# Patient Record
Sex: Female | Born: 1975 | Race: Black or African American | Hispanic: No | Marital: Single | Smoking: Former smoker
Health system: Southern US, Community
[De-identification: ages and names within clinical notes are randomized; demographics above are authoritative.]

## PROBLEM LIST (undated history)

## (undated) DIAGNOSIS — R7309 Other abnormal glucose: Secondary | ICD-10-CM

## (undated) DIAGNOSIS — R12 Heartburn: Secondary | ICD-10-CM

## (undated) DIAGNOSIS — M1990 Unspecified osteoarthritis, unspecified site: Secondary | ICD-10-CM

## (undated) DIAGNOSIS — J45909 Unspecified asthma, uncomplicated: Secondary | ICD-10-CM

## (undated) DIAGNOSIS — M419 Scoliosis, unspecified: Secondary | ICD-10-CM

## (undated) HISTORY — DX: Unspecified osteoarthritis, unspecified site: M19.90

## (undated) HISTORY — DX: Other abnormal glucose: R73.09

## (undated) HISTORY — DX: Unspecified asthma, uncomplicated: J45.909

## (undated) HISTORY — PX: SPINE SURGERY: SHX786

---

## 2000-12-07 ENCOUNTER — Other Ambulatory Visit: Admission: RE | Admit: 2000-12-07 | Discharge: 2000-12-07 | Payer: Self-pay | Admitting: Family Medicine

## 2001-09-27 ENCOUNTER — Encounter: Admission: RE | Admit: 2001-09-27 | Discharge: 2001-09-27 | Payer: Self-pay | Admitting: Family Medicine

## 2005-07-26 ENCOUNTER — Ambulatory Visit: Payer: Self-pay | Admitting: Internal Medicine

## 2005-07-28 ENCOUNTER — Ambulatory Visit: Payer: Self-pay | Admitting: Internal Medicine

## 2005-08-01 ENCOUNTER — Ambulatory Visit: Payer: Self-pay | Admitting: Internal Medicine

## 2005-08-03 ENCOUNTER — Emergency Department (HOSPITAL_COMMUNITY): Admission: EM | Admit: 2005-08-03 | Discharge: 2005-08-04 | Payer: Self-pay | Admitting: Emergency Medicine

## 2005-08-14 ENCOUNTER — Ambulatory Visit: Payer: Self-pay | Admitting: Internal Medicine

## 2005-10-23 ENCOUNTER — Emergency Department (HOSPITAL_COMMUNITY): Admission: EM | Admit: 2005-10-23 | Discharge: 2005-10-23 | Payer: Self-pay | Admitting: Emergency Medicine

## 2006-06-27 ENCOUNTER — Emergency Department (HOSPITAL_COMMUNITY): Admission: EM | Admit: 2006-06-27 | Discharge: 2006-06-27 | Payer: Self-pay | Admitting: Emergency Medicine

## 2006-11-09 ENCOUNTER — Emergency Department (HOSPITAL_COMMUNITY): Admission: EM | Admit: 2006-11-09 | Discharge: 2006-11-09 | Payer: Self-pay | Admitting: Emergency Medicine

## 2006-12-06 ENCOUNTER — Emergency Department (HOSPITAL_COMMUNITY): Admission: EM | Admit: 2006-12-06 | Discharge: 2006-12-06 | Payer: Self-pay | Admitting: Emergency Medicine

## 2009-02-24 ENCOUNTER — Emergency Department (HOSPITAL_COMMUNITY): Admission: EM | Admit: 2009-02-24 | Discharge: 2009-02-24 | Payer: Self-pay | Admitting: Family Medicine

## 2011-07-19 ENCOUNTER — Emergency Department (HOSPITAL_COMMUNITY)
Admission: EM | Admit: 2011-07-19 | Discharge: 2011-07-19 | Disposition: A | Discharge status: Home / Self Care | Payer: Self-pay | Attending: Emergency Medicine | Admitting: Emergency Medicine

## 2011-07-19 DIAGNOSIS — M79609 Pain in unspecified limb: Secondary | ICD-10-CM | POA: Insufficient documentation

## 2011-07-19 DIAGNOSIS — L299 Pruritus, unspecified: Secondary | ICD-10-CM | POA: Insufficient documentation

## 2011-07-19 DIAGNOSIS — R609 Edema, unspecified: Secondary | ICD-10-CM | POA: Insufficient documentation

## 2011-07-19 DIAGNOSIS — L089 Local infection of the skin and subcutaneous tissue, unspecified: Secondary | ICD-10-CM | POA: Insufficient documentation

## 2012-05-15 ENCOUNTER — Emergency Department (HOSPITAL_COMMUNITY)
Admission: EM | Admit: 2012-05-15 | Discharge: 2012-05-15 | Disposition: A | Discharge status: Home / Self Care | Payer: Medicare Other | Attending: Emergency Medicine | Admitting: Emergency Medicine

## 2012-05-15 ENCOUNTER — Encounter (HOSPITAL_COMMUNITY): Payer: Self-pay | Admitting: *Deleted

## 2012-05-15 DIAGNOSIS — T7840XA Allergy, unspecified, initial encounter: Secondary | ICD-10-CM

## 2012-05-15 DIAGNOSIS — L500 Allergic urticaria: Secondary | ICD-10-CM | POA: Insufficient documentation

## 2012-05-15 DIAGNOSIS — F17200 Nicotine dependence, unspecified, uncomplicated: Secondary | ICD-10-CM | POA: Insufficient documentation

## 2012-05-15 MED ORDER — DIPHENHYDRAMINE HCL 25 MG PO CAPS
25.0000 mg | ORAL_CAPSULE | Freq: Once | ORAL | Status: AC
  Administered 2012-05-15: 25 mg via ORAL
  Filled 2012-05-15: qty 1

## 2012-05-15 MED ORDER — PREDNISONE 20 MG PO TABS
60.0000 mg | ORAL_TABLET | ORAL | Status: AC
  Administered 2012-05-15: 60 mg via ORAL
  Filled 2012-05-15: qty 3

## 2012-05-15 MED ORDER — DIPHENHYDRAMINE HCL 25 MG PO CAPS: 25.0000 mg | ORAL_CAPSULE | Freq: Three times a day (TID) | ORAL | Status: DC

## 2012-05-15 MED ORDER — FAMOTIDINE 20 MG PO TABS
20.0000 mg | ORAL_TABLET | Freq: Once | ORAL | Status: AC
  Administered 2012-05-15: 20 mg via ORAL
  Filled 2012-05-15: qty 1

## 2012-05-15 MED ORDER — PREDNISONE 20 MG PO TABS: 60.0000 mg | ORAL_TABLET | ORAL | Status: AC

## 2012-05-15 MED ORDER — FAMOTIDINE 20 MG PO TABS: 60.0000 mg | ORAL_TABLET | Freq: Every day | ORAL | Status: DC

## 2012-05-15 NOTE — Discharge Instructions (Signed)
As discussed, if your symptoms persist or worsen in the next 24 hours you should be seen either here or at clinic for further evaluation. 1 if you do not develop any changes, and your symptoms resolve, please be sure to see the clinic physicians as soon as possible to establish an ongoing relationship, as it is very important to have a primary care physician.

## 2012-05-15 NOTE — ED Provider Notes (Signed)
History   This chart was scribed for Gerhard Munch, MD by Shari Heritage. The patient was seen in room TR09C/TR09C. Patient's care was started at 1437.     CSN: [id number]  Arrival date & time 05/15/12  1437   First MD Initiated Contact with Patient 05/15/12 1548      Chief Complaint  Patient presents with  . Arm Pain  . Rash    (Consider location/radiation/quality/duration/timing/severity/associated sxs/prior treatment) Patient is a 36 y.o. female presenting with rash and hand pain.  Rash  This is a new problem. The current episode started yesterday. The problem has been gradually improving. The problem is associated with an unknown factor. There has been no fever. The rash is present on the left arm. The pain is mild. The pain has been improving since onset. Associated symptoms include pain. She has tried nothing for the symptoms. The treatment provided no relief.  Hand Pain This is a new problem. The current episode started yesterday. The problem occurs constantly. The problem has been gradually improving. Pertinent negatives include no chest pain, no abdominal pain, no headaches and no shortness of breath.   Robin Richards is a 36 y.o. female who presents to the Emergency Department complaining of moderate, left hand pain and swelling. Patient also complains of hives and mild swelling in her left arm. Patient says both sets of symptoms began yesterday. Patient says she has been doing anything differently that she thinks may have caused these changes. Patient denies SOB, fever, chills, nausea, vomiting and diarrhea. Patient also denies weakness in hands or arms bilaterally. Patient with surgical h/o spine surgery. Patient is a current some day smoker.   Patient has no PCP.  History reviewed. No pertinent past medical history.  Past Surgical History  Procedure Date  . Spine surgery     No family history on file.  History  Substance Use Topics  . Smoking status: Current Some  Day Smoker  . Smokeless tobacco: Not on file  . Alcohol Use: No    OB History    Grav Para Term Preterm Abortions TAB SAB Ect Mult Living                  Review of Systems  Constitutional:       Per HPI, otherwise negative  HENT:       Per HPI, otherwise negative  Eyes: Negative.   Respiratory: Negative for shortness of breath.        Per HPI, otherwise negative  Cardiovascular: Negative for chest pain.       Per HPI, otherwise negative  Gastrointestinal: Negative for vomiting and abdominal pain.  Genitourinary: Negative.   Musculoskeletal:       Per HPI, otherwise negative  Skin: Positive for rash.  Neurological: Negative for syncope and headaches.    Allergies  Review of patient's allergies indicates no known allergies.  Home Medications  No current outpatient prescriptions on file.  BP 148/91  Pulse 90  Temp 97.3 F (36.3 C) (Oral)  Resp 16  SpO2 98%  Physical Exam  Nursing note and vitals reviewed. Constitutional: She is oriented to person, place, and time. She appears well-developed and well-nourished. No distress.  HENT:  Head: Normocephalic and atraumatic.  Eyes: Conjunctivae and EOM are normal.  Cardiovascular: Normal rate and regular rhythm.   Pulmonary/Chest: Effort normal and breath sounds normal. No stridor. No respiratory distress.  Abdominal: She exhibits no distension.  Musculoskeletal: She exhibits no edema.  Equal strength in hands.   Neurological: She is alert and oriented to person, place, and time. No cranial nerve deficit.  Skin: Skin is warm and dry.       R ue arm, medial patch, ~10cm of urticarial change  Psychiatric: She has a normal mood and affect.    ED Course  Procedures (including critical care time) DIAGNOSTIC STUDIES:  COORDINATION OF CARE: 4:12PM- Patient informed of current plan for treatment and evaluation and agrees with plan at this time. Will prescribe medication to reduce hives and hand pain. Recommended that  patient follow up with referred physician or return to the ED if symptoms persists or worsen over the next 24 hours.    Labs Reviewed - No data to display No results found.   No diagnosis found.    MDM  I personally performed the services described in this documentation, which was scribed in my presence. The recorded information has been reviewed and considered.  This 36 year old female now presents with concerns of urticarial lesion on the right and hand swelling on the left.  Although there is no identifiable focal lesion, the patient's description of swelling and urticarial rash is most consistent with an allergic reaction.  The patient's endorsement of a gradual improvement in her hand is somewhat reassuring for the likelihood of a resulting phenomena, however given the persistency of the urticarial changes on her right arm she was provided steroids, antihistamines.  With no   Gerhard Munch, MD 05/15/12 1743

## 2012-05-15 NOTE — ED Notes (Signed)
Spoke with pt regarding maintenance at home for allergic reaction. Pt verbalized understanding to wash sheets at home and any blankets. Pt states she will check dog for fleas, states she has not seen any.

## 2012-05-15 NOTE — ED Notes (Signed)
Patient reports she woke with swelling in her left arm.  She has pain up to her shoulder and increased pain with movement. Patient also has noted hive/rash to her arms.

## 2012-09-17 ENCOUNTER — Encounter (HOSPITAL_COMMUNITY): Payer: Self-pay | Admitting: *Deleted

## 2012-09-17 DIAGNOSIS — R0789 Other chest pain: Secondary | ICD-10-CM | POA: Insufficient documentation

## 2012-09-17 DIAGNOSIS — Z79899 Other long term (current) drug therapy: Secondary | ICD-10-CM | POA: Insufficient documentation

## 2012-09-17 DIAGNOSIS — M4120 Other idiopathic scoliosis, site unspecified: Secondary | ICD-10-CM | POA: Insufficient documentation

## 2012-09-17 DIAGNOSIS — F17200 Nicotine dependence, unspecified, uncomplicated: Secondary | ICD-10-CM | POA: Insufficient documentation

## 2012-09-17 DIAGNOSIS — R12 Heartburn: Secondary | ICD-10-CM | POA: Insufficient documentation

## 2012-09-17 DIAGNOSIS — R109 Unspecified abdominal pain: Secondary | ICD-10-CM | POA: Insufficient documentation

## 2012-09-17 DIAGNOSIS — J45909 Unspecified asthma, uncomplicated: Secondary | ICD-10-CM | POA: Insufficient documentation

## 2012-09-17 DIAGNOSIS — R110 Nausea: Secondary | ICD-10-CM | POA: Insufficient documentation

## 2012-09-17 NOTE — ED Notes (Signed)
Pt to ED c/o heartburn and nausea when she laid down after eating barbeque chicken.  She also is c/o lower abd pain, like "when I drink a lot of sodas".  Pt states that she always feels sob when she's sick d/t hx of asthma.  Pt presently burping.

## 2012-09-17 NOTE — ED Notes (Signed)
PT reports chest pressure with heart burn like feeling. Pt also reports SHOB .

## 2012-09-18 ENCOUNTER — Emergency Department (HOSPITAL_COMMUNITY)
Admission: EM | Admit: 2012-09-18 | Discharge: 2012-09-18 | Disposition: A | Discharge status: Home / Self Care | Payer: Medicare Other | Attending: Emergency Medicine | Admitting: Emergency Medicine

## 2012-09-18 ENCOUNTER — Emergency Department (HOSPITAL_COMMUNITY): Payer: Medicare Other

## 2012-09-18 DIAGNOSIS — R0600 Dyspnea, unspecified: Secondary | ICD-10-CM

## 2012-09-18 DIAGNOSIS — R109 Unspecified abdominal pain: Secondary | ICD-10-CM

## 2012-09-18 HISTORY — DX: Scoliosis, unspecified: M41.9

## 2012-09-18 HISTORY — DX: Heartburn: R12

## 2012-09-18 LAB — URINALYSIS, ROUTINE W REFLEX MICROSCOPIC
Glucose, UA: NEGATIVE mg/dL
Ketones, ur: NEGATIVE mg/dL
Protein, ur: NEGATIVE mg/dL

## 2012-09-18 LAB — CBC WITH DIFFERENTIAL/PLATELET
Basophils Absolute: 0.1 10*3/uL (ref 0.0–0.1)
Eosinophils Relative: 4 % (ref 0–5)
HCT: 41.5 % (ref 36.0–46.0)
Lymphocytes Relative: 26 % (ref 12–46)
MCV: 72.7 fL — ABNORMAL LOW (ref 78.0–100.0)
Monocytes Absolute: 1.1 10*3/uL — ABNORMAL HIGH (ref 0.1–1.0)
RDW: 14.6 % (ref 11.5–15.5)
WBC: 13.9 10*3/uL — ABNORMAL HIGH (ref 4.0–10.5)

## 2012-09-18 LAB — COMPREHENSIVE METABOLIC PANEL
ALT: 17 U/L (ref 0–35)
AST: 20 U/L (ref 0–37)
Albumin: 4 g/dL (ref 3.5–5.2)
Alkaline Phosphatase: 121 U/L — ABNORMAL HIGH (ref 39–117)
BUN: 12 mg/dL (ref 6–23)
CO2: 27 mEq/L (ref 19–32)
Calcium: 9.4 mg/dL (ref 8.4–10.5)
Creatinine, Ser: 0.76 mg/dL (ref 0.50–1.10)
Glucose, Bld: 162 mg/dL — ABNORMAL HIGH (ref 70–99)
Total Bilirubin: 0.3 mg/dL (ref 0.3–1.2)

## 2012-09-18 LAB — URINE MICROSCOPIC-ADD ON

## 2012-09-18 MED ORDER — TRAMADOL HCL 50 MG PO TABS: 50.0000 mg | ORAL_TABLET | Freq: Four times a day (QID) | ORAL | Status: DC | PRN

## 2012-09-18 NOTE — ED Provider Notes (Signed)
History     CSN: [id number]  Arrival date & time 09/17/12  2335   First MD Initiated Contact with Patient 09/18/12 0132      Chief Complaint  Patient presents with  . Shortness of Breath  . Nausea  . Heartburn    (Consider location/radiation/quality/duration/timing/severity/associated sxs/prior treatment) Patient is a 36 y.o. female presenting with shortness of breath and heartburn. The history is provided by the patient.  Shortness of Breath  Associated symptoms include shortness of breath. Pertinent negatives include no chest pain, no fever and no cough.  Heartburn Associated symptoms include abdominal pain and shortness of breath. Pertinent negatives include no chest pain and no headaches.   36 year old, female, with scoliosis and asthma, who continues to smoke, presents to emergency department with intermittent shortness of breath, and intermittent, lower abdominal pain.  She denies a cough, fevers, chills, or sweating.  She says the abdominal pain lasts only a minute at a time.  Presently, she is asymptomatic.  She denies urinary tract symptoms, vaginal bleeding or vaginal discharge.  She has not had diarrhea.  Past Medical History  Diagnosis Date  . Heartburn   . Scoliosis     Past Surgical History  Procedure Date  . Spine surgery     No family history on file.  History  Substance Use Topics  . Smoking status: Current Some Day Smoker    Types: Cigarettes  . Smokeless tobacco: Not on file  . Alcohol Use: No    OB History    Grav Para Term Preterm Abortions TAB SAB Ect Mult Living                  Review of Systems  Constitutional: Negative for fever, chills and diaphoresis.  HENT: Negative for congestion.   Respiratory: Positive for shortness of breath. Negative for cough and chest tightness.   Cardiovascular: Negative for chest pain.  Gastrointestinal: Positive for heartburn and abdominal pain. Negative for nausea, vomiting, diarrhea and constipation.    Genitourinary: Negative for dysuria and hematuria.  Neurological: Negative for headaches.  Psychiatric/Behavioral: Negative for confusion.  All other systems reviewed and are negative.    Allergies  Review of patient's allergies indicates no known allergies.  Home Medications   Current Outpatient Rx  Name Route Sig Dispense Refill  . DIPHENHYDRAMINE HCL 25 MG PO CAPS Oral Take 1 capsule (25 mg total) by mouth 3 (three) times daily. 6 capsule 0    BP 121/75  Pulse 84  Temp 97.9 F (36.6 C) (Oral)  Resp 19  SpO2 100%  LMP 08/22/2012  Physical Exam  Nursing note and vitals reviewed. Constitutional: She is oriented to person, place, and time. She appears well-developed and well-nourished. No distress.  HENT:  Head: Normocephalic and atraumatic.  Eyes: Conjunctivae normal and EOM are normal. Pupils are equal, round, and reactive to light.  Neck: Normal range of motion. Neck supple.  Cardiovascular: Normal rate and intact distal pulses.   No murmur heard. Pulmonary/Chest: Effort normal and breath sounds normal. No respiratory distress.  Abdominal: Soft. Bowel sounds are normal. She exhibits no distension. There is no tenderness.  Musculoskeletal: Normal range of motion. She exhibits no edema.  Neurological: She is alert and oriented to person, place, and time.  Skin: Skin is warm and dry.  Psychiatric: She has a normal mood and affect. Thought content normal.    ED Course  Procedures (including critical care time)  Labs Reviewed  URINALYSIS, ROUTINE W REFLEX MICROSCOPIC - Abnormal;  Notable for the following:    APPearance CLOUDY (*)     Hgb urine dipstick TRACE (*)     All other components within normal limits  CBC WITH DIFFERENTIAL - Abnormal; Notable for the following:    WBC 13.9 (*)     RBC 5.71 (*)     MCV 72.7 (*)     MCH 23.8 (*)     Neutro Abs 8.6 (*)     Monocytes Absolute 1.1 (*)     All other components within normal limits  COMPREHENSIVE METABOLIC  PANEL - Abnormal; Notable for the following:    Glucose, Bld 162 (*)     Alkaline Phosphatase 121 (*)     All other components within normal limits  URINE MICROSCOPIC-ADD ON - Abnormal; Notable for the following:    Squamous Epithelial / LPF FEW (*)     All other components within normal limits  TROPONIN I  LIPASE, BLOOD  POCT PREGNANCY, URINE   No results found.   No diagnosis found.    MDM  Dyspnea resolved Abdominal pain, which is crampy in nature, lasting only a minute time is presently, resolved.  No acute abdomen        Cheri Guppy, MD 09/18/12 [phone]

## 2012-11-18 ENCOUNTER — Emergency Department (HOSPITAL_COMMUNITY)
Admission: EM | Admit: 2012-11-18 | Discharge: 2012-11-18 | Disposition: A | Discharge status: Home / Self Care | Payer: Medicare Other | Attending: Emergency Medicine | Admitting: Emergency Medicine

## 2012-11-18 ENCOUNTER — Encounter (HOSPITAL_COMMUNITY): Payer: Self-pay | Admitting: Emergency Medicine

## 2012-11-18 DIAGNOSIS — Z9889 Other specified postprocedural states: Secondary | ICD-10-CM | POA: Insufficient documentation

## 2012-11-18 DIAGNOSIS — M542 Cervicalgia: Secondary | ICD-10-CM | POA: Insufficient documentation

## 2012-11-18 DIAGNOSIS — F17200 Nicotine dependence, unspecified, uncomplicated: Secondary | ICD-10-CM | POA: Insufficient documentation

## 2012-11-18 DIAGNOSIS — Z8739 Personal history of other diseases of the musculoskeletal system and connective tissue: Secondary | ICD-10-CM | POA: Insufficient documentation

## 2012-11-18 DIAGNOSIS — M62838 Other muscle spasm: Secondary | ICD-10-CM | POA: Insufficient documentation

## 2012-11-18 MED ORDER — OXYCODONE-ACETAMINOPHEN 5-325 MG PO TABS: 1.0000 | ORAL_TABLET | Freq: Four times a day (QID) | ORAL | Status: DC | PRN

## 2012-11-18 MED ORDER — IBUPROFEN 800 MG PO TABS: 800.0000 mg | ORAL_TABLET | Freq: Three times a day (TID) | ORAL | Status: DC | PRN

## 2012-11-18 NOTE — ED Notes (Signed)
Pt c/o right shoulder pain x 2 days; pt denies obvious injury but sts hx of similar in past with back problems

## 2012-11-18 NOTE — ED Provider Notes (Signed)
History   This chart was scribed for Jisell Majer B. Bernette Mayers, MD, by Frederik Pear, ER scribe. The patient was seen in room TR07C/TR07C and the patient's care was started at 1424.    CSN: [id number]  Arrival date & time 11/18/12  1408   First MD Initiated Contact with Patient 11/18/12 1424      Chief Complaint  Patient presents with  . Shoulder Pain    (Consider location/radiation/quality/duration/timing/severity/associated sxs/prior treatment) HPI Robin Richards is a 36 y.o. female with a h/o of back surgery and chronic back pain who presents to the Emergency Department complaining of constant, moderate, non-radiating, right shoulder pain that is aggravated by movement and  began 2 days ago. She denies any treatment at home. She has had similar symptoms before. No known injury.    No PCP.   Past Medical History  Diagnosis Date  . Heartburn   . Scoliosis     Past Surgical History  Procedure Date  . Spine surgery     History reviewed. No pertinent family history.  History  Substance Use Topics  . Smoking status: Current Some Day Smoker    Types: Cigarettes  . Smokeless tobacco: Not on file  . Alcohol Use: No    OB History    Grav Para Term Preterm Abortions TAB SAB Ect Mult Living                  Review of Systems A complete 10 system review of systems was obtained and all systems are negative except as noted in the HPI and PMH.  Allergies  Review of patient's allergies indicates no known allergies.  Home Medications   Current Outpatient Rx  Name  Route  Sig  Dispense  Refill  . IBUPROFEN 800 MG PO TABS   Oral   Take 1 tablet (800 mg total) by mouth every 8 (eight) hours as needed for pain.   30 tablet   0   . OXYCODONE-ACETAMINOPHEN 5-325 MG PO TABS   Oral   Take 1-2 tablets by mouth every 6 (six) hours as needed for pain.   20 tablet   0     BP 128/76  Pulse 94  Temp 99.4 F (37.4 C) (Oral)  Resp 20  Ht 5\' 5"  (1.651 m)  Wt 155 lb (70.308 kg)   BMI 25.79 kg/m2  SpO2 98%  Physical Exam  Nursing note and vitals reviewed. Constitutional: She is oriented to person, place, and time. She appears well-developed and well-nourished.  HENT:  Head: Normocephalic and atraumatic.  Eyes: EOM are normal. Pupils are equal, round, and reactive to light.  Neck: Normal range of motion. Neck supple.  Cardiovascular: Normal rate, normal heart sounds and intact distal pulses.   Pulmonary/Chest: Effort normal and breath sounds normal.  Abdominal: Bowel sounds are normal. She exhibits no distension. There is no tenderness.  Musculoskeletal: Normal range of motion. She exhibits no edema and no tenderness.       She has soft tissue tenderness over the right trapezius, but no bony tenderness or deformity. She has full ROM and is neurovascularly intact.  Neurological: She is alert and oriented to person, place, and time. She has normal strength. No cranial nerve deficit or sensory deficit.  Skin: Skin is warm and dry. No rash noted.  Psychiatric: She has a normal mood and affect.    ED Course  Procedures (including critical care time)  DIAGNOSTIC STUDIES: Oxygen Saturation is 98% on room air, normal by my interpretation.  COORDINATION OF CARE:   14:36- Discussed planned course of treatment with the patient, including warm compresses and follow up with a PCP, who is agreeable at this time.   Labs Reviewed - No data to display No results found.   No diagnosis found.    MDM  Muscle spasm, no bony tendenress, no indication for imaging. Pain meds, PCP referral.    I personally performed the services described in this documentation, which was scribed in my presence. The recorded information has been reviewed and is accurate.        Eleen Litz B. Bernette Mayers, MD 11/18/12 [phone]

## 2012-11-18 NOTE — ED Notes (Signed)
Pt woke this am with right neck and shoulder pain. Hx of surgery as a child, "I got a rod in my back".

## 2013-05-26 ENCOUNTER — Emergency Department (HOSPITAL_COMMUNITY)
Admission: EM | Admit: 2013-05-26 | Discharge: 2013-05-26 | Disposition: A | Discharge status: Home / Self Care | Payer: Medicare Other | Attending: Emergency Medicine | Admitting: Emergency Medicine

## 2013-05-26 ENCOUNTER — Encounter (HOSPITAL_COMMUNITY): Payer: Self-pay | Admitting: Emergency Medicine

## 2013-05-26 DIAGNOSIS — Y998 Other external cause status: Secondary | ICD-10-CM | POA: Insufficient documentation

## 2013-05-26 DIAGNOSIS — R12 Heartburn: Secondary | ICD-10-CM | POA: Insufficient documentation

## 2013-05-26 DIAGNOSIS — M4120 Other idiopathic scoliosis, site unspecified: Secondary | ICD-10-CM | POA: Insufficient documentation

## 2013-05-26 DIAGNOSIS — M419 Scoliosis, unspecified: Secondary | ICD-10-CM

## 2013-05-26 DIAGNOSIS — W57XXXA Bitten or stung by nonvenomous insect and other nonvenomous arthropods, initial encounter: Secondary | ICD-10-CM

## 2013-05-26 DIAGNOSIS — F17200 Nicotine dependence, unspecified, uncomplicated: Secondary | ICD-10-CM | POA: Insufficient documentation

## 2013-05-26 DIAGNOSIS — IMO0001 Reserved for inherently not codable concepts without codable children: Secondary | ICD-10-CM | POA: Insufficient documentation

## 2013-05-26 DIAGNOSIS — Y929 Unspecified place or not applicable: Secondary | ICD-10-CM | POA: Insufficient documentation

## 2013-05-26 MED ORDER — DIPHENHYDRAMINE HCL 25 MG PO CAPS
25.0000 mg | ORAL_CAPSULE | Freq: Once | ORAL | Status: AC
  Administered 2013-05-26: 25 mg via ORAL
  Filled 2013-05-26: qty 1

## 2013-05-26 MED ORDER — HYDROCORTISONE 1 % EX CREA: TOPICAL_CREAM | CUTANEOUS | Status: DC

## 2013-05-26 NOTE — ED Notes (Signed)
Started 2 days ago with a "bump" on left forearm. Appears to be a mole but it itches. Has other bites on arm.

## 2013-05-26 NOTE — ED Provider Notes (Signed)
History    CSN: [id number] Arrival date & time 05/26/13  4098  First MD Initiated Contact with Patient 05/26/13 1047     Chief Complaint  Patient presents with  . Rash   (Consider location/radiation/quality/duration/timing/severity/associated sxs/prior Treatment) Patient is a 37 y.o. female presenting with rash. The history is provided by the patient. No language interpreter was used.  Rash Associated symptoms: no chest pain, no chills, no cough, no fever and no shortness of breath   Robin Richards is a 37 y/o F with PMHx of heartburn and scoliosis presenting to the ED with 2 bumps on her left forearm that have been there since 2 days ago - reported that they are extremely pruritic, but have decreased in time. Reported that thye were pink in color in the beginning, but have now scabbed over. Patient reported that she has been using rubbing alcohol with minimal relief. Denied any pain or discomfort. Denied fever, chills, pain with motion, decreased motion, numbness, tingling, hiking, headache, loss of sensation, being in the woods. PCP: none  Past Medical History  Diagnosis Date  . Heartburn   . Scoliosis    Past Surgical History  Procedure Laterality Date  . Spine surgery     No family history on file. History  Substance Use Topics  . Smoking status: Current Some Day Smoker    Types: Cigarettes  . Smokeless tobacco: Not on file  . Alcohol Use: No   OB History   Grav Para Term Preterm Abortions TAB SAB Ect Mult Living                 Review of Systems  Constitutional: Negative for fever and chills.  HENT: Negative for congestion, facial swelling, rhinorrhea and neck pain.   Eyes: Negative for pain and visual disturbance.  Respiratory: Negative for cough, chest tightness and shortness of breath.   Cardiovascular: Negative for chest pain.  Musculoskeletal: Negative for back pain and arthralgias.  Skin: Positive for rash.  Neurological: Negative for dizziness, weakness,  numbness and headaches.  All other systems reviewed and are negative.    Allergies  Review of patient's allergies indicates no known allergies.  Home Medications   Current Outpatient Rx  Name  Route  Sig  Dispense  Refill  . hydrocortisone cream 1 %      Apply to affected area 2 times daily   15 g   0    BP 179/99  Pulse 89  Temp(Src) 97.6 F (36.4 C) (Oral)  SpO2 100% Physical Exam  Nursing note and vitals reviewed. Constitutional: She is oriented to person, place, and time. She appears well-developed and well-nourished. No distress.  HENT:  Head: Normocephalic and atraumatic.  Eyes: Conjunctivae and EOM are normal. Pupils are equal, round, and reactive to light. Right eye exhibits no discharge. Left eye exhibits no discharge.  Neck: Normal range of motion. Neck supple.  Cardiovascular: Normal rate, regular rhythm and normal heart sounds.  Exam reveals no friction rub.   No murmur heard. Pulmonary/Chest: Effort normal and breath sounds normal. No respiratory distress. She has no wheezes. She has no rales.  Lymphadenopathy:    She has no cervical adenopathy.  Neurological: She is alert and oriented to person, place, and time. No cranial nerve deficit. She exhibits normal muscle tone. Coordination normal.  Skin: Skin is warm and dry. No rash noted. She is not diaphoretic. There is erythema.  Mild swelling noted to the left forearm, extensor surface. Two small 0.5 cm bumps  noted - with centers that are indurated, warm to touch, surround erythema. Negative drainage. Negative erythema migrans.    Psychiatric: She has a normal mood and affect. Her behavior is normal. Thought content normal.    ED Course  Procedures (including critical care time) Labs Reviewed - No data to display No results found. 1. Bug bite   2. Scoliosis     MDM  Patient presenting to ED with small 0.5 cm bumps to left extensor surface of forearm. Mild erythema noted, mild warmth. Suspicion to be bug  bites. Doubt septic nature, doubt tic bite. Patient stable, afebrile. Benadryl given in ED setting. Discharged patient with hydrocortisone cream for topical use. Discussed with patient care and hygiene. [facility]-up and dermatologist if worsens. Discussed with patient to monitor symptoms and if symptoms are to worsen or change to report back to the ED - strict return instructions given. Patient agreed to plan of care, understood, all questions answered.   Raymon Mutton, PA-C 05/26/13 1709

## 2013-05-27 NOTE — ED Provider Notes (Signed)
Medical screening examination/treatment/procedure(s) were performed by non-physician practitioner and as supervising physician I was immediately available for consultation/collaboration.  Derwood Kaplan, MD 05/27/13 2116

## 2013-07-15 ENCOUNTER — Encounter (HOSPITAL_COMMUNITY): Payer: Self-pay | Admitting: Nurse Practitioner

## 2013-07-15 ENCOUNTER — Emergency Department (HOSPITAL_COMMUNITY)
Admission: EM | Admit: 2013-07-15 | Discharge: 2013-07-15 | Disposition: A | Discharge status: Home / Self Care | Payer: Medicare Other | Attending: Emergency Medicine | Admitting: Emergency Medicine

## 2013-07-15 DIAGNOSIS — M549 Dorsalgia, unspecified: Secondary | ICD-10-CM

## 2013-07-15 DIAGNOSIS — M546 Pain in thoracic spine: Secondary | ICD-10-CM | POA: Insufficient documentation

## 2013-07-15 DIAGNOSIS — Z8739 Personal history of other diseases of the musculoskeletal system and connective tissue: Secondary | ICD-10-CM | POA: Insufficient documentation

## 2013-07-15 DIAGNOSIS — F17200 Nicotine dependence, unspecified, uncomplicated: Secondary | ICD-10-CM | POA: Insufficient documentation

## 2013-07-15 DIAGNOSIS — R209 Unspecified disturbances of skin sensation: Secondary | ICD-10-CM | POA: Insufficient documentation

## 2013-07-15 DIAGNOSIS — Z9889 Other specified postprocedural states: Secondary | ICD-10-CM | POA: Insufficient documentation

## 2013-07-15 DIAGNOSIS — IMO0002 Reserved for concepts with insufficient information to code with codable children: Secondary | ICD-10-CM | POA: Insufficient documentation

## 2013-07-15 DIAGNOSIS — M542 Cervicalgia: Secondary | ICD-10-CM | POA: Insufficient documentation

## 2013-07-15 MED ORDER — METHOCARBAMOL 500 MG PO TABS: 500.0000 mg | ORAL_TABLET | Freq: Two times a day (BID) | ORAL | Status: DC

## 2013-07-15 NOTE — ED Notes (Addendum)
Pt states the whole R side of her body has been aching for 2 days, took ibuprofen and muscle relaxer with no relief. History of back surgery for scoliosis when she was 13, had rod placed in back, denies any injuries. Ambulatory, [facility]

## 2013-07-15 NOTE — ED Provider Notes (Signed)
  CSN: [id number]     Arrival date & time 07/15/13  1101 History     First MD Initiated Contact with Patient 07/15/13 1212     Chief Complaint  Patient presents with  . Generalized Body Aches   (Consider location/radiation/quality/duration/timing/severity/associated sxs/prior Treatment) HPI Comments: Patient reports right neck and right upper back pain x 2 days.  Denies injury.  States it feels like a muscle spasm.  Has occasional tingling in her right arm.  Pt has significant scoliosis.  Denies fever, chills, neck pain, arm weakness, chest pain, SOB. States she took one "muscle relaxant" last night without improvement and ibuprofen this morning without improvement.   The history is provided by the patient.    Past Medical History  Diagnosis Date  . Heartburn   . Scoliosis    Past Surgical History  Procedure Laterality Date  . Spine surgery     History reviewed. No pertinent family history. History  Substance Use Topics  . Smoking status: Current Some Day Smoker    Types: Cigarettes  . Smokeless tobacco: Not on file  . Alcohol Use: No   OB History   Grav Para Term Preterm Abortions TAB SAB Ect Mult Living                 Review of Systems  Constitutional: Negative for fever and chills.  HENT: Negative for neck pain and neck stiffness.   Respiratory: Negative for cough and shortness of breath.   Cardiovascular: Negative for chest pain.  Neurological: Negative for weakness.    Allergies  Review of patient's allergies indicates no known allergies.  Home Medications   Current Outpatient Rx  Name  Route  Sig  Dispense  Refill  . hydrocortisone cream 1 %      Apply to affected area 2 times daily   15 g   0    BP 138/96  Pulse 99  Temp(Src) 98.2 F (36.8 C) (Oral)  Resp 16  Ht 5\' 4"  (1.626 m)  Wt 167 lb (75.751 kg)  BMI 28.65 kg/m2  SpO2 100% Physical Exam  Nursing note and vitals reviewed. Constitutional: She appears well-developed and well-nourished. No  distress.  HENT:  Head: Normocephalic and atraumatic.  Neck: Neck supple.  Pulmonary/Chest: Effort normal.  Musculoskeletal:       Right shoulder: Normal.       Cervical back: Normal.       Arms: Right upper extremity: Strength 5/5, sensation intact, distal pulses intact.     Neurological: She is alert.  Skin: She is not diaphoretic.    ED Course   Procedures (including critical care time)  Labs Reviewed - No data to display No results found. 1. Upper back pain on right side     MDM  Pt with right upper back pain, no injury, no tenderness, no red flags.  Neurovascularly intact. No neck pain or injury.  Appears to be muscular soreness. D/C home with robaxin four times daily prn, PCP follow up.  Discussed  findings, treatment, follow up  with patient.  Pt given return precautions.  Pt verbalizes understanding and agrees with plan.     Trixie Dredge, PA-C 07/15/13 1249

## 2013-07-15 NOTE — ED Provider Notes (Signed)
Medical screening examination/treatment/procedure(s) were performed by non-physician practitioner and as supervising physician I was immediately available for consultation/collaboration.  Blimy Napoleon L Demiah Gullickson, MD 07/15/13 1650 

## 2013-08-28 ENCOUNTER — Emergency Department (HOSPITAL_COMMUNITY)
Admission: EM | Admit: 2013-08-28 | Discharge: 2013-08-28 | Disposition: A | Discharge status: Home / Self Care | Payer: Medicare Other | Attending: Emergency Medicine | Admitting: Emergency Medicine

## 2013-08-28 ENCOUNTER — Encounter (HOSPITAL_COMMUNITY): Payer: Self-pay

## 2013-08-28 DIAGNOSIS — Z8739 Personal history of other diseases of the musculoskeletal system and connective tissue: Secondary | ICD-10-CM | POA: Insufficient documentation

## 2013-08-28 DIAGNOSIS — F17200 Nicotine dependence, unspecified, uncomplicated: Secondary | ICD-10-CM | POA: Insufficient documentation

## 2013-08-28 DIAGNOSIS — H6091 Unspecified otitis externa, right ear: Secondary | ICD-10-CM

## 2013-08-28 DIAGNOSIS — H60399 Other infective otitis externa, unspecified ear: Secondary | ICD-10-CM | POA: Insufficient documentation

## 2013-08-28 MED ORDER — CIPROFLOXACIN-DEXAMETHASONE 0.3-0.1 % OT SUSP: 4.0000 [drp] | Freq: Two times a day (BID) | OTIC | Status: DC

## 2013-08-28 NOTE — ED Notes (Signed)
Pt presents with 2 day h/o R ear pain.  Pt reports thin drainage to ear.

## 2013-08-28 NOTE — ED Provider Notes (Signed)
Medical screening examination/treatment/procedure(s) were performed by non-physician practitioner and as supervising physician I was immediately available for consultation/collaboration.   Joya Gaskins, MD 08/28/13 [phone]

## 2013-08-28 NOTE — ED Notes (Signed)
Pt states pain 8/10 to rt ear X2 days.  Pt states there was clear drainage but has stopped.  Pt states she did use a qtip to clean ear but stopped bc it was hurting to deep.  Pt alert oriented X4

## 2013-08-28 NOTE — ED Provider Notes (Signed)
CSN: [id number]     Arrival date & time 08/28/13  1344 History   First MD Initiated Contact with Patient 08/28/13 1412     No chief complaint on file.  (Consider location/radiation/quality/duration/timing/severity/associated sxs/prior Treatment) HPI Patient presents to the emergency department for evaluation of right ear pain which she has had for 2 days. She reports a thin drainage coming from her ear and also endorses that it has been itching. She says she used to have 2-3 years and she was younger. Denies getting frequent ear infections as an adult. Denies having underlying medical issues. Denies having fever, sore throat, neck pain, cough, shortness of breath, weakness. Past Medical History  Diagnosis Date  . Heartburn   . Scoliosis    Past Surgical History  Procedure Laterality Date  . Spine surgery     History reviewed. No pertinent family history. History  Substance Use Topics  . Smoking status: Current Some Day Smoker    Types: Cigarettes  . Smokeless tobacco: Not on file  . Alcohol Use: No   OB History   Grav Para Term Preterm Abortions TAB SAB Ect Mult Living                 Review of Systems  HENT: Positive for ear pain.   All other systems reviewed and are negative.    Allergies  Review of patient's allergies indicates no known allergies.  Home Medications   Current Outpatient Rx  Name  Route  Sig  Dispense  Refill  . ciprofloxacin-dexamethasone (CIPRODEX) otic suspension   Right Ear   Place 4 drops into the right ear 2 (two) times daily.   7.5 mL   0    BP 126/67  Pulse 90  Temp(Src) 97.7 F (36.5 C) (Oral)  Resp 18  SpO2 98%  LMP 08/24/2013 Physical Exam  Nursing note and vitals reviewed. Constitutional: She appears well-developed and well-nourished. No distress.  HENT:  Head: Normocephalic and atraumatic.  Right Ear: There is drainage and tenderness.  Left Ear: Tympanic membrane and ear canal normal.  Eyes: Pupils are equal, round, and  reactive to light.  Neck: Normal range of motion. Neck supple.  Cardiovascular: Normal rate and regular rhythm.   Pulmonary/Chest: Effort normal.  Abdominal: Soft.  Neurological: She is alert.  Skin: Skin is warm and dry.    ED Course  Procedures (including critical care time) Labs Review Labs Reviewed - No data to display Imaging Review No results found.  MDM   1. Otitis externa of right ear    Otitis externa without concerns of mastoiditis. No systemic symptoms. Will treat with Ciprodex otic drops.  37 y.o.Robin Richards's evaluation in the Emergency Department is complete. It has been determined that no acute conditions requiring further emergency intervention are present at this time. The patient/guardian have been advised of the diagnosis and plan. We have discussed signs and symptoms that warrant return to the ED, such as changes or worsening in symptoms.  Vital signs are stable at discharge. Filed Vitals:   08/28/13 1358  BP: 126/67  Pulse: 90  Temp: 97.7 F (36.5 C)  Resp: 18    Patient/guardian has voiced understanding and agreed to follow-up with the PCP or specialist.    Dorthula Matas, PA-C 08/28/13 1447  Dorthula Matas, PA-C 08/28/13 1448

## 2013-09-29 ENCOUNTER — Encounter (HOSPITAL_COMMUNITY): Payer: Self-pay | Admitting: Emergency Medicine

## 2013-09-29 ENCOUNTER — Emergency Department (INDEPENDENT_AMBULATORY_CARE_PROVIDER_SITE_OTHER)
Admission: EM | Admit: 2013-09-29 | Discharge: 2013-09-29 | Disposition: A | Discharge status: Home / Self Care | Payer: Medicare Other | Source: Home / Self Care | Attending: Emergency Medicine | Admitting: Emergency Medicine

## 2013-09-29 DIAGNOSIS — M5431 Sciatica, right side: Secondary | ICD-10-CM

## 2013-09-29 DIAGNOSIS — M5430 Sciatica, unspecified side: Secondary | ICD-10-CM

## 2013-09-29 MED ORDER — KETOROLAC TROMETHAMINE 60 MG/2ML IM SOLN
INTRAMUSCULAR | Status: AC
  Filled 2013-09-29: qty 2

## 2013-09-29 MED ORDER — MELOXICAM 15 MG PO TABS: 15.0000 mg | ORAL_TABLET | Freq: Every day | ORAL | Status: DC

## 2013-09-29 MED ORDER — TRAMADOL HCL 50 MG PO TABS: 100.0000 mg | ORAL_TABLET | Freq: Three times a day (TID) | ORAL | Status: DC | PRN

## 2013-09-29 MED ORDER — KETOROLAC TROMETHAMINE 60 MG/2ML IM SOLN
60.0000 mg | Freq: Once | INTRAMUSCULAR | Status: AC
  Administered 2013-09-29: 60 mg via INTRAMUSCULAR

## 2013-09-29 MED ORDER — METHOCARBAMOL 500 MG PO TABS: 500.0000 mg | ORAL_TABLET | Freq: Three times a day (TID) | ORAL | Status: DC

## 2013-09-29 NOTE — ED Notes (Signed)
C/o upper mid back pain at bra line. Hx of corrective back surgery for scoliosis. Denies injury. Pt has been taking ibuprofen with no relief. Onset 3 days ago.   States pain is a constant ache.

## 2013-09-29 NOTE — ED Provider Notes (Signed)
Chief Complaint:   Chief Complaint  Patient presents with  . Back Pain    upper mid back. hx of back surgery    History of Present Illness:   Robin Richards is a 37 year old female who's had a three-day history of pain in her right lower back with radiation into her buttock, down the posterior thigh and into her calf. The leg feels numb and tingly. The pain is rated an 8/10 in intensity. It's worse with any movement. She tried ibuprofen without relief. She denies any weakness. She's had no dysuria, frequency, urgency, hematuria, urinary incontinence, or urinary retention. No incontinence of stool. She denies any abdominal pain. She's had no fever, chills, or unintended weight loss. She has a history of scoliosis and has a Harrington rod. She denies any injury to her back. No previous history of back or disc problems.  Review of Systems:  Other than noted above, the patient denies any of the following symptoms: Systemic:  No fever, chills, severe fatigue, or unexplained weight loss. GI:  No abdominal pain, nausea, vomiting, diarrhea, constipation, incontinence of bowel, or blood in stool. GU:  No dysuria, frequency, urgency, or hematuria. No incontinence of urine or difficulty urinating.  M-S:  No neck pain, joint pain, arthritis, or myalgias. Neuro:  No paresthesias, saddle anesthesia, muscular weakness, or progressive neurological deficit.  PMFSH:  Past medical history, family history, social history, meds, and allergies were reviewed. Specifically, there is no history of cancer, major trauma, osteoporosis, immunosuppression, or HIV infection.   Physical Exam:   Vital signs:  BP 122/74  Pulse 56  Temp(Src) 97.9 F (36.6 C) (Oral)  Resp 16  SpO2 100%  LMP 09/23/2013 General:  Alert, oriented, in no distress. Abdomen:  Soft, non-tender.  No organomegaly or mass.  No pulsatile midline abdominal mass or bruit. Back:  She has a scar from a Harrington rod. The back has a limited range of motion  with only about 45 of flexion with pain. This may be due to the Harrington rod placement. There is pain to palpation just above the iliac crest in the sacroiliac joint. Straight leg raising was positive on the right with a positive Lasegue's sign and positive popliteal compression. Straight leg raising negative on the left. Neuro:  Normal muscle strength, sensations and DTRs. Extremities: Pedal pulses were full, there was no edema. Skin:  Clear, warm and dry.  No rash.  Assessment:  The encounter diagnosis was Sciatica, right.  Recommended conservative treatment for right now. Will need to followup with orthopedics.  Plan:   1.  Meds:  The following meds were prescribed:   Discharge Medication List as of 09/29/2013  4:25 PM    START taking these medications   Details  meloxicam (MOBIC) 15 MG tablet Take 1 tablet (15 mg total) by mouth daily., Starting 09/29/2013, Until Discontinued, Normal    methocarbamol (ROBAXIN) 500 MG tablet Take 1 tablet (500 mg total) by mouth 3 (three) times daily., Starting 09/29/2013, Until Discontinued, Normal    traMADol (ULTRAM) 50 MG tablet Take 2 tablets (100 mg total) by mouth every 8 (eight) hours as needed for pain., Starting 09/29/2013, Until Discontinued, Normal        2.  Patient Education/Counseling:  The patient was given appropriate handouts, self care instructions, and instructed in symptomatic relief. The patient was encouraged to try to be as active as possible and given some exercises to do followed by moist heat.  3.  Follow up:  The patient was  told to follow up if no better in 3 to 4 days, if becoming worse in any way, and given some red flag symptoms such as worsening pain or new neurological symptoms which would prompt immediate return.  Follow up with Dr. Charlann Boxer in 2 weeks.     Reuben Likes, MD 09/29/13 [phone]

## 2013-12-04 ENCOUNTER — Encounter (HOSPITAL_COMMUNITY): Payer: Self-pay | Admitting: Emergency Medicine

## 2013-12-04 ENCOUNTER — Emergency Department (HOSPITAL_COMMUNITY)
Admission: EM | Admit: 2013-12-04 | Discharge: 2013-12-04 | Disposition: A | Discharge status: Home / Self Care | Payer: Medicare Other | Attending: Emergency Medicine | Admitting: Emergency Medicine

## 2013-12-04 DIAGNOSIS — Z8739 Personal history of other diseases of the musculoskeletal system and connective tissue: Secondary | ICD-10-CM | POA: Insufficient documentation

## 2013-12-04 DIAGNOSIS — R062 Wheezing: Secondary | ICD-10-CM | POA: Insufficient documentation

## 2013-12-04 DIAGNOSIS — Z3202 Encounter for pregnancy test, result negative: Secondary | ICD-10-CM | POA: Insufficient documentation

## 2013-12-04 DIAGNOSIS — Z79899 Other long term (current) drug therapy: Secondary | ICD-10-CM | POA: Insufficient documentation

## 2013-12-04 DIAGNOSIS — K529 Noninfective gastroenteritis and colitis, unspecified: Secondary | ICD-10-CM

## 2013-12-04 DIAGNOSIS — K5289 Other specified noninfective gastroenteritis and colitis: Secondary | ICD-10-CM | POA: Insufficient documentation

## 2013-12-04 DIAGNOSIS — F17200 Nicotine dependence, unspecified, uncomplicated: Secondary | ICD-10-CM | POA: Insufficient documentation

## 2013-12-04 LAB — URINALYSIS, ROUTINE W REFLEX MICROSCOPIC
Bilirubin Urine: NEGATIVE
Glucose, UA: NEGATIVE mg/dL
HGB URINE DIPSTICK: NEGATIVE
Ketones, ur: NEGATIVE mg/dL
Leukocytes, UA: NEGATIVE
Nitrite: NEGATIVE
PH: 7 (ref 5.0–8.0)
Protein, ur: NEGATIVE mg/dL
SPECIFIC GRAVITY, URINE: 1.029 (ref 1.005–1.030)
Urobilinogen, UA: 1 mg/dL (ref 0.0–1.0)

## 2013-12-04 LAB — COMPREHENSIVE METABOLIC PANEL
ALK PHOS: 125 U/L — AB (ref 39–117)
ALT: 16 U/L (ref 0–35)
AST: 16 U/L (ref 0–37)
Albumin: 4.1 g/dL (ref 3.5–5.2)
BUN: 11 mg/dL (ref 6–23)
CALCIUM: 8.9 mg/dL (ref 8.4–10.5)
CO2: 28 meq/L (ref 19–32)
Chloride: 100 mEq/L (ref 96–112)
Creatinine, Ser: 0.65 mg/dL (ref 0.50–1.10)
GLUCOSE: 141 mg/dL — AB (ref 70–99)
POTASSIUM: 4 meq/L (ref 3.7–5.3)
Sodium: 139 mEq/L (ref 137–147)
TOTAL PROTEIN: 7.5 g/dL (ref 6.0–8.3)
Total Bilirubin: 0.3 mg/dL (ref 0.3–1.2)

## 2013-12-04 LAB — CBC WITH DIFFERENTIAL/PLATELET
Basophils Absolute: 0 10*3/uL (ref 0.0–0.1)
Basophils Relative: 0 % (ref 0–1)
Eosinophils Absolute: 0.2 10*3/uL (ref 0.0–0.7)
Eosinophils Relative: 3 % (ref 0–5)
HCT: 41.9 % (ref 36.0–46.0)
Hemoglobin: 13.7 g/dL (ref 12.0–15.0)
Lymphocytes Relative: 30 % (ref 12–46)
Lymphs Abs: 2.4 10*3/uL (ref 0.7–4.0)
MCH: 23.4 pg — ABNORMAL LOW (ref 26.0–34.0)
MCHC: 32.7 g/dL (ref 30.0–36.0)
MCV: 71.5 fL — ABNORMAL LOW (ref 78.0–100.0)
Monocytes Absolute: 0.8 10*3/uL (ref 0.1–1.0)
Monocytes Relative: 10 % (ref 3–12)
Neutro Abs: 4.6 10*3/uL (ref 1.7–7.7)
Neutrophils Relative %: 57 % (ref 43–77)
Platelets: 257 10*3/uL (ref 150–400)
RBC: 5.86 MIL/uL — ABNORMAL HIGH (ref 3.87–5.11)
RDW: 14.4 % (ref 11.5–15.5)
WBC: 8 10*3/uL (ref 4.0–10.5)

## 2013-12-04 LAB — POCT PREGNANCY, URINE: Preg Test, Ur: NEGATIVE

## 2013-12-04 LAB — LIPASE, BLOOD: Lipase: 28 U/L (ref 11–59)

## 2013-12-04 MED ORDER — ONDANSETRON HCL 4 MG/2ML IJ SOLN
4.0000 mg | Freq: Once | INTRAMUSCULAR | Status: AC
  Administered 2013-12-04: 4 mg via INTRAVENOUS
  Filled 2013-12-04: qty 2

## 2013-12-04 MED ORDER — ONDANSETRON 4 MG PO TBDP
8.0000 mg | ORAL_TABLET | Freq: Once | ORAL | Status: AC
  Administered 2013-12-04: 8 mg via ORAL

## 2013-12-04 MED ORDER — SODIUM CHLORIDE 0.9 % IV SOLN: 1000.0000 mL | INTRAVENOUS | Status: DC

## 2013-12-04 MED ORDER — SODIUM CHLORIDE 0.9 % IV SOLN
1000.0000 mL | Freq: Once | INTRAVENOUS | Status: AC
  Administered 2013-12-04: 1000 mL via INTRAVENOUS

## 2013-12-04 MED ORDER — ALBUTEROL SULFATE (2.5 MG/3ML) 0.083% IN NEBU
5.0000 mg | INHALATION_SOLUTION | Freq: Once | RESPIRATORY_TRACT | Status: AC
  Administered 2013-12-04: 5 mg via RESPIRATORY_TRACT
  Filled 2013-12-04: qty 6

## 2013-12-04 MED ORDER — MORPHINE SULFATE 4 MG/ML IJ SOLN
4.0000 mg | Freq: Once | INTRAMUSCULAR | Status: AC
  Administered 2013-12-04: 4 mg via INTRAVENOUS
  Filled 2013-12-04: qty 1

## 2013-12-04 MED ORDER — PROMETHAZINE HCL 25 MG PO TABS: 25.0000 mg | ORAL_TABLET | Freq: Four times a day (QID) | ORAL | Status: DC | PRN

## 2013-12-04 MED ORDER — ALBUTEROL SULFATE HFA 108 (90 BASE) MCG/ACT IN AERS: 2.0000 | INHALATION_SPRAY | RESPIRATORY_TRACT | Status: DC | PRN

## 2013-12-04 NOTE — ED Notes (Signed)
Crackers and water given per order.

## 2013-12-04 NOTE — ED Provider Notes (Signed)
Medical screening examination/treatment/procedure(s) were performed by non-physician practitioner and as supervising physician I was immediately available for consultation/collaboration.  EKG Interpretation   None         Ephraim Hamburger, MD 12/04/13 2344

## 2013-12-04 NOTE — ED Notes (Addendum)
Pt c/o vomiting up everything she eats for 3 days.  Yesterday she was first constipated and then began having diarrhea.  C/o lower abdominal pain.  Pt with 500 ml pink frothy emesis in triage (states ate "pork, weanies and koolaide" for lunch).

## 2013-12-04 NOTE — Discharge Instructions (Signed)
1. Medications: albuterol for wheezing, phenergan for nausea, usual home medications 2. Treatment: rest, drink plenty of fluids, advance diet slowly 3. Follow Up: Please followup with your primary doctor for discussion of your diagnoses and further evaluation after today's visit; if you do not have a primary care doctor use the resource guide provided to find one;   Viral Gastroenteritis Viral gastroenteritis is also known as stomach flu. This condition affects the stomach and intestinal tract. It can cause sudden diarrhea and vomiting. The illness typically lasts 3 to 8 days. Most people develop an immune response that eventually gets rid of the virus. While this natural response develops, the virus can make you quite ill. CAUSES  Many different viruses can cause gastroenteritis, such as rotavirus or noroviruses. You can catch one of these viruses by consuming contaminated food or water. You may also catch a virus by sharing utensils or other personal items with an infected person or by touching a contaminated surface. SYMPTOMS  The most common symptoms are diarrhea and vomiting. These problems can cause a severe loss of body fluids (dehydration) and a body salt (electrolyte) imbalance. Other symptoms may include:  Fever.  Headache.  Fatigue.  Abdominal pain. DIAGNOSIS  Your caregiver can usually diagnose viral gastroenteritis based on your symptoms and a physical exam. A stool sample may also be taken to test for the presence of viruses or other infections. TREATMENT  This illness typically goes away on its own. Treatments are aimed at rehydration. The most serious cases of viral gastroenteritis involve vomiting so severely that you are not able to keep fluids down. In these cases, fluids must be given through an intravenous line (IV). HOME CARE INSTRUCTIONS   Drink enough fluids to keep your urine clear or pale yellow. Drink small amounts of fluids frequently and increase the amounts as  tolerated.  Ask your caregiver for specific rehydration instructions.  Avoid:  Foods high in sugar.  Alcohol.  Carbonated drinks.  Tobacco.  Juice.  Caffeine drinks.  Extremely hot or cold fluids.  Fatty, greasy foods.  Too much intake of anything at one time.  Dairy products until 24 to 48 hours after diarrhea stops.  You may consume probiotics. Probiotics are active cultures of beneficial bacteria. They may lessen the amount and number of diarrheal stools in adults. Probiotics can be found in yogurt with active cultures and in supplements.  Wash your hands well to avoid spreading the virus.  Only take over-the-counter or prescription medicines for pain, discomfort, or fever as directed by your caregiver. Do not give aspirin to children. Antidiarrheal medicines are not recommended.  Ask your caregiver if you should continue to take your regular prescribed and over-the-counter medicines.  Keep all follow-up appointments as directed by your caregiver. SEEK IMMEDIATE MEDICAL CARE IF:   You are unable to keep fluids down.  You do not urinate at least once every 6 to 8 hours.  You develop shortness of breath.  You notice blood in your stool or vomit. This may look like coffee grounds.  You have abdominal pain that increases or is concentrated in one small area (localized).  You have persistent vomiting or diarrhea.  You have a fever.  The patient is a child younger than 3 months, and he or she has a fever.  The patient is a child older than 3 months, and he or she has a fever and persistent symptoms.  The patient is a child older than 3 months, and he or she  has a fever and symptoms suddenly get worse.  The patient is a baby, and he or she has no tears when crying. MAKE SURE YOU:   Understand these instructions.  Will watch your condition.  Will get help right away if you are not doing well or get worse. Document Released: 11/13/2005 Document Revised:  02/05/2012 Document Reviewed: 08/30/2011 [facility] Patient Information 2014 [place].    Emergency Department Resource Guide 1) Find a Doctor and Pay Out of Pocket Although you won't have to find out who is covered by your insurance plan, it is a good idea to ask around and get recommendations. You will then need to call the office and see if the doctor you have chosen will accept you as a new patient and what types of options they offer for patients who are self-pay. Some doctors offer discounts or will set up payment plans for their patients who do not have insurance, but you will need to ask so you aren't surprised when you get to your appointment.  2) Contact Your Local Health Department Not all health departments have doctors that can see patients for sick visits, but many do, so it is worth a call to see if yours does. If you don't know where your local health department is, you can check in your phone book. The CDC also has a tool to help you locate your state's health department, and many state websites also have listings of all of their local health departments.  3) Find a [facility] If your illness is not likely to be very severe or complicated, you may want to try a walk in clinic. These are popping up all over the country in pharmacies, drugstores, and shopping centers. They're usually staffed by nurse practitioners or physician assistants that have been trained to treat common illnesses and complaints. They're usually fairly quick and inexpensive. However, if you have serious medical issues or chronic medical problems, these are probably not your best option.  No Primary Care Doctor: - Call Health Connect at  [phone] - they can help you locate a primary care doctor that  accepts your insurance, provides certain services, etc. - Physician Referral Service- [phone]  Chronic Pain Problems: Organization         Address  Phone   Notes  [facility]   [phone] Patients need to be referred by their primary care doctor.   Medication Assistance: Organization         Address  Phone   Notes  [facility] Medication [facility] [address] [place], [place] [address] [phone] --Must be a resident of [facility] -- Must have NO insurance coverage whatsoever (no Medicaid/ Medicare, etc.) -- The pt. MUST have a primary care doctor that directs their care regularly and follows them in the community   MedAssist  [phone]   [employer]  [phone]    Agencies that provide inexpensive medical care: Organization         Address  Phone   Notes  [place]  [phone]   [facility]    [phone]   [facility] [place], [place] [address] [phone]   [place] [phone]   Planned Parenthood    [phone]   [facility]    [phone]   [facility] and [facility]  [place] [address], [place] Phone:  [phone], Fax:  [phone] Hours of Operation:  9 am - 6 pm, M-F.  Also accepts Medicaid/Medicare and self-pay.  [facility] for National [place], [address], [place] Phone: [phone], Fax: [phone]. Hours of Operation:  8:30 am - 5:30 pm, M-F.  Also accepts Medicaid and self-pay.  [facility] [place] [address], [place] Phone: [phone]   [place], [place], [place] [phone], Ext. [phone] Mondays & Thursdays: 7-9 AM.  First 15 patients are seen on a first come, first serve basis.    [place] Providers:  Organization         Address  Phone   Notes  [facility] [address], [place] [phone] Also accepts self-pay patients.  [facility] [address], [place]  [phone]   [place], [address], [place] [phone]   [facility] [address] [place] [phone]   Lucianne Lei [address], [place]   [phone] Only accepts [place] Access [place] patients after they have their name applied to their card.   Self-Pay (no insurance) in [facility]:  Organization         Address  Phone   Notes  Sickle Cell Patients, [facility] [place] [phone]   [facility] Urgent Care [place] [phone]   [facility] [place]  [place], [place], [place] [phone]   [facility]/Dr. Osei-Bonsu  [address] [place] or [address], [place] [phone] Phone number for both [place] and [place] locations is the same.  Urgent Medical [facility] [address] [place] [phone]   [facility] [address] [place] or [address] [phone]   [facility] [address] [place] [phone], phone; [phone], fax Sees patients 1st and 3rd Saturday of every month.  Must not qualify for public or private insurance (i.e. Medicaid, Medicare, [place] Health Choice, Veterans' Benefits)  Household income should be no more than 200% of the poverty level The clinic cannot treat you if you are pregnant or think you are pregnant  Sexually transmitted diseases are not treated at the clinic.    Dental Care: Organization         Address  Phone  Notes  [facility] [phone] Accepts children up to age 84 who are enrolled in [place] or [place]; pregnant women with a Medicaid card; and children who have applied for Medicaid or [place] Health  Choice, but were declined, whose parents can pay a reduced fee at time of service.  [facility] Department of [facility]  [address], [place] [phone] Accepts children up to age 49 who are enrolled in [place] or [place]; pregnant women with a Medicaid card; and children who have applied for Medicaid or [place] Health Choice, but were declined, whose parents can pay a reduced fee at time of service.  [facility] Adult Dental Access PROGRAM  [place], [place] [phone]  Patients are seen by appointment only. Walk-ins are not accepted. [place] will see patients 66 years of age and older. Monday - Tuesday (8am-5pm) Most Wednesdays (8:30-5pm) $30 per visit, cash only  [facility] Adult Dental Access PROGRAM  [address], [facility] [phone] Patients are seen by appointment only. Walk-ins are not accepted. [place] will see patients 25 years of age and older. One Wednesday Evening (Monthly: Volunteer Based).  $30 per visit, cash only  [place]  [phone] for adults; Children under age 74, call Graduate Pediatric Dentistry at [phone]. Children aged 11-14, please call [phone] to request a pediatric application.  Dental services are provided in all areas of dental care including fillings, crowns and bridges, complete and partial dentures, implants, gum treatment, root canals, and extractions. Preventive care is also provided. Treatment is provided to both adults and children. Patients are selected via a lottery and there is often a waiting list.   [facility] [address] [place]  [phone] [email or web address]   [facility] [address] [place], [place] [phone], Ext. [phone] Second and Fourth Thursday of each month, opens at 6:30 AM; Clinic ends at 9 AM.  Patients are seen on a first-come first-served basis, and a limited number are seen during each  clinic.   [facility]  [address] [place] D[place]er [place], [place] [phone]   Eligibility Requirements You must have lived in [place], [place], or [place] counties for at least the last three months.   You cannot be eligible for state or federal sponsored [employer], including [employer], [place], or [employer].   You generally cannot be eligible for healthcare insurance through your employer.    How to apply: Eligibility screenings are held every Tuesday and Wednesday afternoon from 1:00 pm until 4:00 pm. You do not need an appointment for the interview!  [facility] [address], [place], [place]   [place]  [place] Department  [place]  [phone]    Behavioral Health Resources in the Community: Intensive Outpatient Programs Organization         Address  Phone  Notes  Sigurd [place]. [address], [place], [place] [phone]   [facility] Outpatient [address] [place], [place]   ADS: Alcohol & Drug Svcs [address] [place], [place]   [place] [address],  [place], [place] or [phone]   Substance Abuse Resources Organization         Address  Phone  Notes  Alcohol and Drug Services  [phone]   [place]  [phone]   [facility] [place]   Chinita Pester  [phone]   Residential & Outpatient Substance Abuse Program  [phone]   Psychological Services Organization         Address  Phone  Notes  [facility]  [place]  [phone]   [place] [address] [place] or [phone]    Mobile Crisis Teams Organization         Address  Phone  Notes  Therapeutic Alternatives, Mobile  Crisis Care Unit  [phone]   [facility]ive Psychotherapeutic Services  [address] [place], [place]   [facility] [address] [place]  [place] [phone]    Self-Help/Support Groups Organization         Address  Phone             Notes  Mental Health Assoc. of [place] - variety of support groups  [place] Call for more information  Narcotics Anonymous (NA), [facility] [address], [employer] [place]  2 meetings at this location   Special educational needs teacher         Address  Phone  Notes  ASAP Residential Treatment [place],    [place]  [phone]   [facility]  [address] [place] [phone], [place], [place]   [place], [place] [phone] Admissions: 8am-3pm M-F  Incentives Substance [place] [address]    [place], [place] [phone]   [facility] [address] [place], [place], [place]   [facility] [address],  [place], [place]   Insight Programs - Intensive Outpatient [address], [place], [place]   [facility] [address].,  [place], [place] [phone] or [phone]   [facility]idential Treatment Services (RTS) [address] [place], [place] Accepts Medicaid  [facility] [place] [address],  [place] [phone] Substance Abuse/Addiction Treatment   [facility] Organization         Address  Phone  Notes  [facility]  [phone]   Domenic Schwab, PhD [address] Arlis Porta [place], [place]   [phone] or [phone]   [place], [place] [phone]   [facility] [address] [place], [place] [phone] Insurance/Medicaid/sponsorship through [facility] and Families [address]                                    [place], [place] [phone] [place] [address] [place], [place] [phone]    Dr. Adele Schilder  [phone]   [facility] [place] Dept. 1) [address]  2) [place] 3)  [place] [address], [place] [phone]  [phone]   [place] [phone] or [phone] (After Hours)

## 2013-12-04 NOTE — ED Provider Notes (Signed)
CSN: [id number]     Arrival date & time 12/04/13  1636 History   First MD Initiated Contact with Patient 12/04/13 1926     Chief Complaint  Patient presents with  . Emesis  . Abdominal Pain   (Consider location/radiation/quality/duration/timing/severity/associated sxs/prior Treatment) The history is provided by the patient and medical records. No language interpreter was used.    Robin Richards is a 38 y.o. female  with a hx of GERD presents to the Emergency Department complaining of gradual, persistent, progressively worsening N/V onset 3 days ago and worse in the AM. She has not attempted any OTC treatments and nothing makes it better or worse.  Emesis is NBNB and includes stomach contents.  No bloody diarrhea.  Pt denies fever, chills, headache, neck pain, chest pain, SOB, weakness, dizziness, syncope.  Pt reports her abd cramping and nausea occur just prior to emesis but is improved after vomiting.  LMP: Dec 28th 2014   Past Medical History  Diagnosis Date  . Heartburn   . Scoliosis    Past Surgical History  Procedure Laterality Date  . Spine surgery     No family history on file. History  Substance Use Topics  . Smoking status: Current Some Day Smoker -- 0.50 packs/day    Types: Cigarettes  . Smokeless tobacco: Not on file  . Alcohol Use: No   OB History   Grav Para Term Preterm Abortions TAB SAB Ect Mult Living                 Review of Systems  Constitutional: Negative for fever, diaphoresis, appetite change, fatigue and unexpected weight change.  HENT: Negative for mouth sores and trouble swallowing.   Respiratory: Negative for cough, chest tightness, shortness of breath, wheezing and stridor.   Cardiovascular: Negative for chest pain and palpitations.  Gastrointestinal: Positive for nausea, vomiting, abdominal pain and diarrhea. Negative for constipation, blood in stool, abdominal distention and rectal pain.  Genitourinary: Negative for dysuria, urgency, frequency,  hematuria, flank pain and difficulty urinating.  Musculoskeletal: Negative for back pain, neck pain and neck stiffness.  Skin: Negative for rash.  Neurological: Negative for weakness.  Hematological: Negative for adenopathy.  Psychiatric/Behavioral: Negative for confusion.  All other systems reviewed and are negative.    Allergies  Review of patient's allergies indicates no known allergies.  Home Medications   Current Outpatient Rx  Name  Route  Sig  Dispense  Refill  . Misc Natural Products (ENERGY SUPPORT PO)   Oral   Take 1 tablet by mouth every morning.         Marland Kitchen albuterol (PROVENTIL HFA;VENTOLIN HFA) 108 (90 BASE) MCG/ACT inhaler   Inhalation   Inhale 2 puffs into the lungs every 4 (four) hours as needed for wheezing or shortness of breath.   1 Inhaler   3   . promethazine (PHENERGAN) 25 MG tablet   Oral   Take 1 tablet (25 mg total) by mouth every 6 (six) hours as needed for nausea or vomiting.   12 tablet   0    BP 117/71  Pulse 87  Temp(Src) 98.1 F (36.7 C) (Oral)  Resp 16  Ht 5' 4"  (1.626 m)  Wt 157 lb (71.215 kg)  BMI 26.94 kg/m2  SpO2 100%  LMP 11/23/2013 Physical Exam  Nursing note and vitals reviewed. Constitutional: She is oriented to person, place, and time. She appears well-developed and well-nourished. No distress.  HENT:  Head: Normocephalic and atraumatic.  Right Ear: Tympanic membrane,  external ear and ear canal normal.  Left Ear: Tympanic membrane, external ear and ear canal normal.  Nose: No mucosal edema or rhinorrhea. No epistaxis. Right sinus exhibits no maxillary sinus tenderness and no frontal sinus tenderness. Left sinus exhibits no maxillary sinus tenderness and no frontal sinus tenderness.  Mouth/Throat: Uvula is midline, oropharynx is clear and moist and mucous membranes are normal. Mucous membranes are not pale and not cyanotic. No oropharyngeal exudate, posterior oropharyngeal edema, posterior oropharyngeal erythema or tonsillar  abscesses.  Mucous membranes moist  Eyes: Conjunctivae are normal. Pupils are equal, round, and reactive to light. No scleral icterus.  Neck: Normal range of motion and full passive range of motion without pain.  Cardiovascular: Normal rate, regular rhythm, normal heart sounds and intact distal pulses.   No murmur heard. Pulmonary/Chest: Effort normal. No accessory muscle usage or stridor. Not tachypneic. No respiratory distress. She has decreased breath sounds. She has wheezes. She has no rhonchi. She has no rales.  Diminished and wheezing throughout  Abdominal: Soft. Bowel sounds are normal. She exhibits no distension and no mass. There is no tenderness. There is no rebound, no guarding and no CVA tenderness.  Abdomen soft and nontender  Musculoskeletal: Normal range of motion.  Lymphadenopathy:    She has no cervical adenopathy.  Neurological: She is alert and oriented to person, place, and time. She exhibits normal muscle tone. Coordination normal.  Skin: Skin is warm and dry. No rash noted. She is not diaphoretic. No erythema.  Psychiatric: She has a normal mood and affect.    ED Course  Procedures (including critical care time) Labs Review Labs Reviewed  CBC WITH DIFFERENTIAL - Abnormal; Notable for the following:    RBC 5.86 (*)    MCV 71.5 (*)    MCH 23.4 (*)    All other components within normal limits  COMPREHENSIVE METABOLIC PANEL - Abnormal; Notable for the following:    Glucose, Bld 141 (*)    Alkaline Phosphatase 125 (*)    All other components within normal limits  URINALYSIS, ROUTINE W REFLEX MICROSCOPIC - Abnormal; Notable for the following:    APPearance CLOUDY (*)    All other components within normal limits  LIPASE, BLOOD  POCT PREGNANCY, URINE   Imaging Review No results found.  EKG Interpretation   None       MDM   1. Gastroenteritis      Robin Richards presents with intermittent vomiting for 3 days as well as diarrhea.  Patient alert and  oriented, nontoxic and nonseptic appearing here in the emergency department. Patient without hypotension, tachycardia or fever.  Will give fluids, anti-medics and reassess.  Labs and concerning as she has no leukocytosis, hyperglycemia or elevated lipase.    10:32 PM Patient with significant improvement in symptoms and wishes to be discharged home. Clear and equal breath sounds after albuterol treatment. Patient request perception for home nebulizers.  Will by mouth trial and if she does well DC home.  11:07 PM Patient abdominal exam remains benign after repeat exams. Patient tolerates by mouth without difficulty. No emesis here in the department.  Patient requests albuterol for home  Patient with symptoms consistent with viral gastritis.  Vitals are stable, no fever.  Patient is nontoxic, nonseptic appearing, in no apparent distress.  Patient does not meet the SIRS or Sepsis criteria.  Pt's symptoms have been managed in the department; fluid bolus given.  No signs of dehydration, tolerating PO fluids > 6 oz.  Lungs are  clear.  No focal abdominal pain, no peritoneal signs, no concern for appendicitis, cholecystitis, pancreatitis, ruptured viscus, UTI, kidney stone, ectopic pregnancy or any other abdominal etiology.  Supportive therapy indicated with return if symptoms worsen.  Patient counseled.  I have also discussed reasons to return immediately to the ER.  Patient expresses understanding and agrees with plan.  It has been determined that no acute conditions requiring further emergency intervention are present at this time. The patient/guardian have been advised of the diagnosis and plan. We have discussed signs and symptoms that warrant return to the ED, such as changes or worsening in symptoms.   Vital signs are stable at discharge.   BP 117/71  Pulse 87  Temp(Src) 98.1 F (36.7 C) (Oral)  Resp 16  Ht 5' 4"  (1.626 m)  Wt 157 lb (71.215 kg)  BMI 26.94 kg/m2  SpO2 100%  LMP  11/23/2013  Patient/guardian has voiced understanding and agreed to follow-up with the PCP or specialist.       Abigail Butts, PA-C 12/04/13 2314

## 2014-04-08 ENCOUNTER — Encounter (HOSPITAL_COMMUNITY): Payer: Self-pay | Admitting: Emergency Medicine

## 2014-04-08 DIAGNOSIS — R062 Wheezing: Secondary | ICD-10-CM | POA: Insufficient documentation

## 2014-04-08 DIAGNOSIS — R0602 Shortness of breath: Secondary | ICD-10-CM | POA: Insufficient documentation

## 2014-04-08 DIAGNOSIS — Z8739 Personal history of other diseases of the musculoskeletal system and connective tissue: Secondary | ICD-10-CM | POA: Insufficient documentation

## 2014-04-08 DIAGNOSIS — J329 Chronic sinusitis, unspecified: Secondary | ICD-10-CM | POA: Insufficient documentation

## 2014-04-08 DIAGNOSIS — F17200 Nicotine dependence, unspecified, uncomplicated: Secondary | ICD-10-CM | POA: Insufficient documentation

## 2014-04-08 DIAGNOSIS — R42 Dizziness and giddiness: Secondary | ICD-10-CM | POA: Insufficient documentation

## 2014-04-08 DIAGNOSIS — R51 Headache: Secondary | ICD-10-CM | POA: Insufficient documentation

## 2014-04-08 LAB — CBC WITH DIFFERENTIAL/PLATELET
Basophils Absolute: 0 10*3/uL (ref 0.0–0.1)
Basophils Relative: 0 % (ref 0–1)
Eosinophils Absolute: 0.4 10*3/uL (ref 0.0–0.7)
Eosinophils Relative: 4 % (ref 0–5)
HEMATOCRIT: 36.5 % (ref 36.0–46.0)
HEMOGLOBIN: 11.9 g/dL — AB (ref 12.0–15.0)
Lymphocytes Relative: 31 % (ref 12–46)
Lymphs Abs: 3.1 10*3/uL (ref 0.7–4.0)
MCH: 23 pg — ABNORMAL LOW (ref 26.0–34.0)
MCHC: 32.6 g/dL (ref 30.0–36.0)
MCV: 70.5 fL — ABNORMAL LOW (ref 78.0–100.0)
MONOS PCT: 8 % (ref 3–12)
Monocytes Absolute: 0.8 10*3/uL (ref 0.1–1.0)
Neutro Abs: 5.7 10*3/uL (ref 1.7–7.7)
Neutrophils Relative %: 57 % (ref 43–77)
PLATELETS: 294 10*3/uL (ref 150–400)
RBC: 5.18 MIL/uL — ABNORMAL HIGH (ref 3.87–5.11)
RDW: 14.7 % (ref 11.5–15.5)
WBC: 10 10*3/uL (ref 4.0–10.5)

## 2014-04-08 LAB — COMPREHENSIVE METABOLIC PANEL
ALK PHOS: 108 U/L (ref 39–117)
ALT: 12 U/L (ref 0–35)
AST: 13 U/L (ref 0–37)
Albumin: 3.7 g/dL (ref 3.5–5.2)
BUN: 11 mg/dL (ref 6–23)
CALCIUM: 8.9 mg/dL (ref 8.4–10.5)
CO2: 26 meq/L (ref 19–32)
Chloride: 103 mEq/L (ref 96–112)
Creatinine, Ser: 0.71 mg/dL (ref 0.50–1.10)
GFR calc Af Amer: >90 mL/min (ref >90)
GLUCOSE: 143 mg/dL — AB (ref 70–99)
Potassium: 3.8 mEq/L (ref 3.7–5.3)
SODIUM: 140 meq/L (ref 137–147)
Total Bilirubin: 0.2 mg/dL — ABNORMAL LOW (ref 0.3–1.2)
Total Protein: 6.8 g/dL (ref 6.0–8.3)

## 2014-04-08 LAB — TROPONIN I: Troponin I: <0.3 ng/mL (ref <0.30)

## 2014-04-08 NOTE — ED Notes (Signed)
No answer when called 

## 2014-04-08 NOTE — ED Notes (Signed)
Headache dizziness with lt arm pain  n and v today lmp  May 8

## 2014-04-09 ENCOUNTER — Emergency Department (HOSPITAL_COMMUNITY)
Admission: EM | Admit: 2014-04-09 | Discharge: 2014-04-09 | Disposition: A | Discharge status: Home / Self Care | Payer: Medicare Other | Attending: Emergency Medicine | Admitting: Emergency Medicine

## 2014-04-09 ENCOUNTER — Emergency Department (HOSPITAL_COMMUNITY): Payer: Medicare Other

## 2014-04-09 DIAGNOSIS — R062 Wheezing: Secondary | ICD-10-CM

## 2014-04-09 DIAGNOSIS — J329 Chronic sinusitis, unspecified: Secondary | ICD-10-CM

## 2014-04-09 DIAGNOSIS — R42 Dizziness and giddiness: Secondary | ICD-10-CM

## 2014-04-09 MED ORDER — IBUPROFEN 400 MG PO TABS
600.0000 mg | ORAL_TABLET | Freq: Once | ORAL | Status: AC
  Administered 2014-04-09: 600 mg via ORAL
  Filled 2014-04-09 (×2): qty 1

## 2014-04-09 MED ORDER — OXYMETAZOLINE HCL 0.05 % NA SOLN: 1.0000 | Freq: Two times a day (BID) | NASAL | Status: DC

## 2014-04-09 MED ORDER — LORATADINE 10 MG PO TABS
10.0000 mg | ORAL_TABLET | Freq: Once | ORAL | Status: AC
  Administered 2014-04-09: 10 mg via ORAL
  Filled 2014-04-09: qty 1

## 2014-04-09 MED ORDER — MOMETASONE FUROATE 50 MCG/ACT NA SUSP: 2.0000 | Freq: Every day | NASAL | Status: DC

## 2014-04-09 MED ORDER — IBUPROFEN 600 MG PO TABS: 600.0000 mg | ORAL_TABLET | Freq: Four times a day (QID) | ORAL | Status: DC | PRN

## 2014-04-09 MED ORDER — ALBUTEROL SULFATE HFA 108 (90 BASE) MCG/ACT IN AERS
2.0000 | INHALATION_SPRAY | Freq: Once | RESPIRATORY_TRACT | Status: AC
  Administered 2014-04-09: 2 via RESPIRATORY_TRACT
  Filled 2014-04-09: qty 6.7

## 2014-04-09 MED ORDER — LORATADINE 10 MG PO TABS: 10.0000 mg | ORAL_TABLET | Freq: Every day | ORAL | Status: DC

## 2014-04-09 MED ORDER — OXYMETAZOLINE HCL 0.05 % NA SOLN
1.0000 | Freq: Once | NASAL | Status: AC
  Administered 2014-04-09: 1 via NASAL
  Filled 2014-04-09: qty 15

## 2014-04-09 NOTE — ED Notes (Signed)
Returned from xray

## 2014-04-09 NOTE — ED Provider Notes (Signed)
CSN: [id number]     Arrival date & time 04/08/14  2211 History   First MD Initiated Contact with Patient 04/09/14 0025     Chief Complaint  Patient presents with  . Dizziness     (Consider location/radiation/quality/duration/timing/severity/associated sxs/prior Treatment) HPI Patient presents with left-sided facial pain worse when bending forward. She's been going on for several days. Is associated with lightheadedness. She also said nasal congestion and sore throat. She's had no fever chills. Denies any neck pain or stiffness. She's had a mild cough and some shortness of breath. Denies any focal weakness or numbness. Past Medical History  Diagnosis Date  . Heartburn   . Scoliosis    Past Surgical History  Procedure Laterality Date  . Spine surgery     No family history on file. History  Substance Use Topics  . Smoking status: Current Some Day Smoker -- 0.50 packs/day    Types: Cigarettes  . Smokeless tobacco: Not on file  . Alcohol Use: No   OB History   Grav Para Term Preterm Abortions TAB SAB Ect Mult Living                 Review of Systems  Constitutional: Negative for fever and chills.  HENT: Positive for congestion, rhinorrhea, sinus pressure and sore throat.   Respiratory: Positive for cough and shortness of breath.   Gastrointestinal: Negative for abdominal pain.  Genitourinary: Negative for dysuria.  Musculoskeletal: Negative for back pain, neck pain and neck stiffness.  Skin: Negative for rash and wound.  Neurological: Positive for dizziness, light-headedness and headaches. Negative for syncope, weakness and numbness.  All other systems reviewed and are negative.     Allergies  Review of patient's allergies indicates no known allergies.  Home Medications   Prior to Admission medications   Medication Sig Start Date End Date Taking? Authorizing Provider  methocarbamol (ROBAXIN) 500 MG tablet Take 1,000 mg by mouth every 8 (eight) hours as needed for  muscle spasms.   Yes Historical Provider, MD  Misc Natural Products (ENERGY SUPPORT PO) Take 1 tablet by mouth daily.   Yes Historical Provider, MD  traMADol (ULTRAM) 50 MG tablet Take 100 mg by mouth every 6 (six) hours as needed for moderate pain.   Yes Historical Provider, MD   BP 150/132  Pulse 83  Temp(Src) 98.2 F (36.8 C) (Oral)  Resp 20  Ht 5' 4"  (1.626 m)  Wt 153 lb (69.4 kg)  BMI 26.25 kg/m2  SpO2 100%  LMP 04/08/2014 Physical Exam  Nursing note and vitals reviewed. Constitutional: She is oriented to person, place, and time. She appears well-developed and well-nourished. No distress.  HENT:  Head: Normocephalic and atraumatic.  Mouth/Throat: Oropharynx is clear and moist. No oropharyngeal exudate.  Bilateral nasal mucosal edema. Patient has frontal sinus tenderness to percussion  Eyes: EOM are normal. Pupils are equal, round, and reactive to light.  Neck: Normal range of motion. Neck supple.  Cardiovascular: Normal rate and regular rhythm.  Exam reveals no gallop and no friction rub.   No murmur heard. Pulmonary/Chest: Effort normal. No respiratory distress. She has wheezes (few scattered end expiratory wheezing). She has no rales. She exhibits no tenderness.  Abdominal: Soft. Bowel sounds are normal. She exhibits no distension and no mass. There is no tenderness. There is no rebound and no guarding.  Musculoskeletal: Normal range of motion. She exhibits no edema and no tenderness.  Neurological: She is alert and oriented to person, place, and time.  Patient  is alert and oriented x3 with clear, goal oriented speech. Patient has 5/5 motor in all extremities. Sensation is intact to light touch. Bilateral finger-to-nose is normal with no signs of dysmetria. Patient has a normal gait and walks without assistance.   Skin: Skin is warm and dry. No rash noted. No erythema.  Psychiatric: She has a normal mood and affect. Her behavior is normal.    ED Course  Procedures  (including critical care time) Labs Review Labs Reviewed  CBC WITH DIFFERENTIAL - Abnormal; Notable for the following:    RBC 5.18 (*)    Hemoglobin 11.9 (*)    MCV 70.5 (*)    MCH 23.0 (*)    All other components within normal limits  COMPREHENSIVE METABOLIC PANEL - Abnormal; Notable for the following:    Glucose, Bld 143 (*)    Total Bilirubin 0.2 (*)    All other components within normal limits  TROPONIN I    Imaging Review Dg Chest 2 View  04/09/2014   CLINICAL DATA:  Cough.  Shortness of breath.  Dizziness.  EXAM: CHEST  2 VIEW  COMPARISON:  DG CHEST 2 VIEW dated 09/18/2012  FINDINGS: Thoracolumbar scoliosis convex towards the right with rod and cerclage wire fixation. Heart size and pulmonary vascularity are normal. Lungs appear clear and expanded. No focal airspace disease. No blunting of costophrenic angles. No pneumothorax. No change since prior study.  IMPRESSION: No evidence of active pulmonary disease. Thoracic scoliosis with posterior rod fixation.   Electronically Signed   By: Lucienne Capers M.D.   On: 04/09/2014 01:13     EKG Interpretation None      MDM   Final diagnoses:  None    Patient appears to have sinus infection with URI symptoms. We'll treat with nasal sprays and inhaler. Return precautions given.    Julianne Rice, MD 04/09/14 [phone]

## 2014-04-09 NOTE — ED Notes (Signed)
Pt c/o of headache, left shoulder down to left leg hurt,with SOB, running nose with C/o cough. Pt stated she hard breathing treatment earlier (1:00am)

## 2014-04-09 NOTE — ED Notes (Signed)
Patient transported to X-ray 

## 2014-04-09 NOTE — Discharge Instructions (Signed)
Sinusitis Sinusitis is redness, soreness, and swelling (inflammation) of the paranasal sinuses. Paranasal sinuses are air pockets within the bones of your face (beneath the eyes, the middle of the forehead, or above the eyes). In healthy paranasal sinuses, mucus is able to drain out, and air is able to circulate through them by way of your nose. However, when your paranasal sinuses are inflamed, mucus and air can become trapped. This can allow bacteria and other germs to grow and cause infection. Sinusitis can develop quickly and last only a short time (acute) or continue over a long period (chronic). Sinusitis that lasts for more than 12 weeks is considered chronic.  CAUSES  Causes of sinusitis include:  Allergies.  Structural abnormalities, such as displacement of the cartilage that separates your nostrils (deviated septum), which can decrease the air flow through your nose and sinuses and affect sinus drainage.  Functional abnormalities, such as when the small hairs (cilia) that line your sinuses and help remove mucus do not work properly or are not present. SYMPTOMS  Symptoms of acute and chronic sinusitis are the same. The primary symptoms are pain and pressure around the affected sinuses. Other symptoms include:  Upper toothache.  Earache.  Headache.  Bad breath.  Decreased sense of smell and taste.  A cough, which worsens when you are lying flat.  Fatigue.  Fever.  Thick drainage from your nose, which often is green and may contain pus (purulent).  Swelling and warmth over the affected sinuses. DIAGNOSIS  Your caregiver will perform a physical exam. During the exam, your caregiver may:  Look in your nose for signs of abnormal growths in your nostrils (nasal polyps).  Tap over the affected sinus to check for signs of infection.  View the inside of your sinuses (endoscopy) with a special imaging device with a light attached (endoscope), which is inserted into your  sinuses. If your caregiver suspects that you have chronic sinusitis, one or more of the following tests may be recommended:  Allergy tests.  Nasal culture A sample of mucus is taken from your nose and sent to a lab and screened for bacteria.  Nasal cytology A sample of mucus is taken from your nose and examined by your caregiver to determine if your sinusitis is related to an allergy. TREATMENT  Most cases of acute sinusitis are related to a viral infection and will resolve on their own within 10 days. Sometimes medicines are prescribed to help relieve symptoms (pain medicine, decongestants, nasal steroid sprays, or saline sprays).  However, for sinusitis related to a bacterial infection, your caregiver will prescribe antibiotic medicines. These are medicines that will help kill the bacteria causing the infection.  Rarely, sinusitis is caused by a fungal infection. In theses cases, your caregiver will prescribe antifungal medicine. For some cases of chronic sinusitis, surgery is needed. Generally, these are cases in which sinusitis recurs more than 3 times per year, despite other treatments. HOME CARE INSTRUCTIONS   Drink plenty of water. Water helps thin the mucus so your sinuses can drain more easily.  Use a humidifier.  Inhale steam 3 to 4 times a day (for example, sit in the bathroom with the shower running).  Apply a warm, moist washcloth to your face 3 to 4 times a day, or as directed by your caregiver.  Use saline nasal sprays to help moisten and clean your sinuses.  Take over-the-counter or prescription medicines for pain, discomfort, or fever only as directed by your caregiver. [place]  CARE IF:  You have increasing pain or severe headaches.  You have nausea, vomiting, or drowsiness.  You have swelling around your face.  You have vision problems.  You have a stiff neck.  You have difficulty breathing. MAKE SURE YOU:   Understand these  instructions.  Will watch your condition.  Will get help right away if you are not doing well or get worse. Document Released: 11/13/2005 Document Revised: 02/05/2012 Document Reviewed: 11/28/2011 [facility] Patient Information 2014 [place], [place].  Upper Respiratory Infection, Adult An upper respiratory infection (URI) is also known as the common cold. It is often caused by a type of germ (virus). Colds are easily spread (contagious). You can pass it to others by kissing, coughing, sneezing, or drinking out of the same glass. Usually, you get better in 1 or 2 weeks.  HOME CARE   Only take medicine as told by your doctor.  Use a warm mist humidifier or breathe in steam from a hot shower.  Drink enough water and fluids to keep your pee (urine) clear or pale yellow.  Get plenty of rest.  Return to work when your temperature is back to normal or as told by your doctor. You may use a face mask and wash your hands to stop your cold from spreading. GET HELP RIGHT AWAY IF:   After the first few days, you feel you are getting worse.  You have questions about your medicine.  You have chills, shortness of breath, or brown or red spit (mucus).  You have yellow or brown snot (nasal discharge) or pain in the face, especially when you bend forward.  You have a fever, puffy (swollen) neck, pain when you swallow, or white spots in the back of your throat.  You have a bad headache, ear pain, sinus pain, or chest pain.  You have a high-pitched whistling sound when you breathe in and out (wheezing).  You have a lasting cough or cough up blood.  You have sore muscles or a stiff neck. MAKE SURE YOU:   Understand these instructions.  Will watch your condition.  Will get help right away if you are not doing well or get worse. Document Released: 05/01/2008 Document Revised: 02/05/2012 Document Reviewed: 03/20/2011 [facility] Patient Information 2014 [place], [place].

## 2014-05-02 ENCOUNTER — Emergency Department (HOSPITAL_COMMUNITY): Payer: Medicare Other

## 2014-05-02 ENCOUNTER — Encounter (HOSPITAL_COMMUNITY): Payer: Self-pay | Admitting: Emergency Medicine

## 2014-05-02 ENCOUNTER — Emergency Department (HOSPITAL_COMMUNITY)
Admission: EM | Admit: 2014-05-02 | Discharge: 2014-05-02 | Disposition: A | Discharge status: Home / Self Care | Payer: Medicare Other | Attending: Emergency Medicine | Admitting: Emergency Medicine

## 2014-05-02 DIAGNOSIS — M25562 Pain in left knee: Secondary | ICD-10-CM

## 2014-05-02 DIAGNOSIS — Z8739 Personal history of other diseases of the musculoskeletal system and connective tissue: Secondary | ICD-10-CM | POA: Insufficient documentation

## 2014-05-02 DIAGNOSIS — Z79899 Other long term (current) drug therapy: Secondary | ICD-10-CM | POA: Insufficient documentation

## 2014-05-02 DIAGNOSIS — F17200 Nicotine dependence, unspecified, uncomplicated: Secondary | ICD-10-CM | POA: Insufficient documentation

## 2014-05-02 DIAGNOSIS — M25569 Pain in unspecified knee: Secondary | ICD-10-CM | POA: Insufficient documentation

## 2014-05-02 MED ORDER — HYDROCODONE-ACETAMINOPHEN 5-325 MG PO TABS: 1.0000 | ORAL_TABLET | Freq: Four times a day (QID) | ORAL | Status: DC | PRN

## 2014-05-02 MED ORDER — OXYCODONE-ACETAMINOPHEN 5-325 MG PO TABS
1.0000 | ORAL_TABLET | Freq: Once | ORAL | Status: AC
  Administered 2014-05-02: 1 via ORAL
  Filled 2014-05-02: qty 1

## 2014-05-02 NOTE — ED Notes (Signed)
38 yo with c/o generalized pain more specifically in the left knee starting 3 days ago. Recently seen in Dr. Gabriel Carina for tx of ear infection (right) and fluid on left knee. Denies n/v/d.Pt walked in with [facility] from home. Vitals stable.

## 2014-05-02 NOTE — Discharge Instructions (Signed)
Keep knee elevated. Ice several times a day. Knee sleeve for support. Follow up with your doctor for recheck as soon as able.    Knee Pain Knee pain can be a result of an injury or other medical conditions. Treatment will depend on the cause of your pain. HOME CARE  Only take medicine as told by your doctor.  Keep a healthy weight. Being overweight can make the knee hurt more.  Stretch before exercising or playing sports.  If there is constant knee pain, change the way you exercise. Ask your doctor for advice.  Make sure shoes fit well. Choose the right shoe for the sport or activity.  Protect your knees. Wear kneepads if needed.  Rest when you are tired. GET HELP RIGHT AWAY IF:   Your knee pain does not stop.  Your knee pain does not get better.  Your knee joint feels hot to the touch.  You have a fever. MAKE SURE YOU:   Understand these instructions.  Will watch this condition.  Will get help right away if you are not doing well or get worse. Document Released: 02/09/2009 Document Revised: 02/05/2012 Document Reviewed: 02/09/2009 [facility] Patient Information 2014 [place], [place].

## 2014-05-02 NOTE — ED Notes (Signed)
Small raised Knot located on dorsal side of ankle. NO discharge noted

## 2014-05-02 NOTE — ED Provider Notes (Signed)
Medical screening examination/treatment/procedure(s) were conducted as a shared visit with non-physician practitioner(s) and myself.  I personally evaluated the patient during the encounter.   Pt c/o left knee swelling. Denies fall/injury. No erythema or increased warmth. Knee stable, no effusion. Distal pulses palp.   Mirna Mires, MD 05/02/14 [phone]

## 2014-05-02 NOTE — ED Provider Notes (Signed)
CSN: [id number]     Arrival date & time 05/02/14  3545 History   First MD Initiated Contact with Patient 05/02/14 0940     Chief Complaint  Patient presents with  . Knee Pain    left     (Consider location/radiation/quality/duration/timing/severity/associated sxs/prior Treatment) HPI Robin Richards is a 38 y.o. female who presents to emergency department complaining of diffuse body aches and joint aches. States main complaint is left knee pain. He began to her 3 days ago. She denies any injuries. She states that she has pain that is worsened with walking, bending her knee. Denies any fever or chills. States she is having generalized weakness. Denies any dizziness, chest pain, shortness of breath, abdominal pain, nausea, vomiting, diarrhea. She states she has chronic back pain for which she takes tramadol, and took this morning with no relief of pain. Denies loss of bladder or bowel control. No urinary symptoms. No numbness in extremities. States pain is in bilateral shoulders, arms, back, knees, feet.   Past Medical History  Diagnosis Date  . Heartburn   . Scoliosis    Past Surgical History  Procedure Laterality Date  . Spine surgery     No family history on file. History  Substance Use Topics  . Smoking status: Current Some Day Smoker -- 0.50 packs/day    Types: Cigarettes  . Smokeless tobacco: Not on file  . Alcohol Use: No   OB History   Grav Para Term Preterm Abortions TAB SAB Ect Mult Living                 Review of Systems  Constitutional: Negative for fever and chills.  Respiratory: Negative for cough, chest tightness and shortness of breath.   Cardiovascular: Negative for chest pain, palpitations and leg swelling.  Gastrointestinal: Negative for nausea, vomiting, abdominal pain and diarrhea.  Genitourinary: Negative for dysuria, flank pain and pelvic pain.  Musculoskeletal: Positive for arthralgias, back pain and myalgias. Negative for neck pain and neck stiffness.   Skin: Negative for rash.  Neurological: Positive for weakness. Negative for dizziness, numbness and headaches.  All other systems reviewed and are negative.     Allergies  Review of patient's allergies indicates no known allergies.  Home Medications   Prior to Admission medications   Medication Sig Start Date End Date Taking? Authorizing Provider  ibuprofen (ADVIL,MOTRIN) 600 MG tablet Take 1 tablet (600 mg total) by mouth every 6 (six) hours as needed. 04/09/14  Yes Julianne Rice, MD  loratadine (CLARITIN) 10 MG tablet Take 1 tablet (10 mg total) by mouth daily. 04/09/14  Yes Julianne Rice, MD  mometasone (NASONEX) 50 MCG/ACT nasal spray Place 2 sprays into the nose daily. 04/09/14  Yes Julianne Rice, MD  oxymetazoline (AFRIN NASAL SPRAY) 0.05 % nasal spray Place 1 spray into both nostrils 2 (two) times daily. 04/09/14  Yes Julianne Rice, MD  traMADol (ULTRAM) 50 MG tablet Take 100 mg by mouth every 6 (six) hours as needed for moderate pain.   Yes Historical Provider, MD   BP 129/90  Pulse 81  Temp(Src) 98.7 F (37.1 C) (Oral)  Resp 18  Ht 5' 4"  (1.626 m)  Wt 150 lb (68.04 kg)  BMI 25.73 kg/m2  SpO2 98%  LMP 04/07/2014 Physical Exam  Nursing note and vitals reviewed. Constitutional: She is oriented to person, place, and time. She appears well-developed and well-nourished. No distress.  HENT:  Head: Normocephalic.  Eyes: Conjunctivae are normal.  Neck: Neck supple.  Cardiovascular: Normal  rate, regular rhythm, normal heart sounds and intact distal pulses.   Pulmonary/Chest: Effort normal and breath sounds normal. No respiratory distress. She has no wheezes. She has no rales.  Abdominal: Soft. Bowel sounds are normal. She exhibits no distension. There is no tenderness. There is no rebound.  Musculoskeletal: She exhibits no edema.  Normal appearing left knee. Tender to palpation over medial joint. Full ROM of the knee joint. Pain with full flexion and extension. Negative  anterior and posterior drawer signs. No laxity or pain with medial or lateral stress.    Neurological: She is alert and oriented to person, place, and time.  Skin: Skin is warm and dry.  Psychiatric: She has a normal mood and affect. Her behavior is normal.    ED Course  Procedures (including critical care time) Labs Review Labs Reviewed - No data to display  Imaging Review Dg Knee Complete 4 Views Left  05/02/2014   CLINICAL DATA:  Pain in the left knee radiating into the left inner thigh.  EXAM: LEFT KNEE - COMPLETE 4+ VIEW  COMPARISON:  No priors.  FINDINGS: No acute displaced fracture, subluxation or dislocation. Mild joint space narrowing, subchondral sclerosis and osteophyte formation, predominantly in the medial compartment, compatible with osteoarthritis.  IMPRESSION: 1. No acute radiographic abnormality of the left knee. 2. 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   Electronically Signed   By: Vinnie Langton M.D.   On: 05/02/2014 11:26     EKG Interpretation None      MDM   Final diagnoses:  Left knee pain    , Body aches, main complaint is left knee pain. X-ray obtained here shows mild arthritis. Based on exam and no signs of infection. She's afebrile. Nontoxic appearing. Home with a knee sleeve, 10 Norco as, continue ibuprofen. She states she has a doctor to followup with.  Filed Vitals:   05/02/14 0937 05/02/14 0943 05/02/14 1056 05/02/14 1100  BP:  129/90 120/71 117/69  Pulse:  81 76 68  Temp:  98.7 F (37.1 C)    TempSrc:  Oral    Resp:  18 15   Height:  5' 4"  (1.626 m)    Weight:  150 lb (68.04 kg)    SpO2: 97% 98% 100% 100%     Renold Genta, PA-C 05/02/14 1154

## 2014-05-07 ENCOUNTER — Emergency Department (HOSPITAL_COMMUNITY)
Admission: EM | Admit: 2014-05-07 | Discharge: 2014-05-07 | Disposition: A | Discharge status: Home / Self Care | Payer: Medicare Other | Attending: Emergency Medicine | Admitting: Emergency Medicine

## 2014-05-07 ENCOUNTER — Encounter (HOSPITAL_COMMUNITY): Payer: Self-pay | Admitting: Emergency Medicine

## 2014-05-07 DIAGNOSIS — M79671 Pain in right foot: Secondary | ICD-10-CM

## 2014-05-07 DIAGNOSIS — Z79899 Other long term (current) drug therapy: Secondary | ICD-10-CM | POA: Insufficient documentation

## 2014-05-07 DIAGNOSIS — IMO0002 Reserved for concepts with insufficient information to code with codable children: Secondary | ICD-10-CM | POA: Diagnosis not present

## 2014-05-07 DIAGNOSIS — F17200 Nicotine dependence, unspecified, uncomplicated: Secondary | ICD-10-CM | POA: Diagnosis not present

## 2014-05-07 DIAGNOSIS — L84 Corns and callosities: Secondary | ICD-10-CM | POA: Diagnosis not present

## 2014-05-07 DIAGNOSIS — M79609 Pain in unspecified limb: Secondary | ICD-10-CM | POA: Diagnosis present

## 2014-05-07 MED ORDER — SULFAMETHOXAZOLE-TRIMETHOPRIM 800-160 MG PO TABS
1.0000 | ORAL_TABLET | Freq: Two times a day (BID) | ORAL | Status: DC
Start: 2014-05-07 — End: 2014-07-01

## 2014-05-07 MED ORDER — MELOXICAM 15 MG PO TABS: 15.0000 mg | ORAL_TABLET | Freq: Every day | ORAL | Status: DC

## 2014-05-07 NOTE — ED Provider Notes (Signed)
Medical screening examination/treatment/procedure(s) were performed by non-physician practitioner and as supervising physician I was immediately available for consultation/collaboration.  Hoy Morn, MD 05/07/14 [phone]

## 2014-05-07 NOTE — ED Notes (Signed)
PT has this foot wound to right foot, ? Wart.  Pt states she does not remember stepping on anything.  PT states bone on top of right foot has been sticking out for "a minute"

## 2014-05-07 NOTE — ED Provider Notes (Signed)
CSN: [id number]     Arrival date & time 05/07/14  1242 History  This chart was scribed for non-physician practitioner, Phineas Inches, PA-C, working with Hoy Morn, MD by Roe Coombs, ED Scribe. This patient was seen in room TR06C/TR06C and the patient's care was started at 1:20 PM.   Chief Complaint  Patient presents with  . Foot Pain    Patient is a 38 y.o. female presenting with lower extremity pain. The history is provided by the patient. No language interpreter was used.  Foot Pain This is a new problem. The current episode started more than 1 week ago. The problem occurs constantly. The problem has not changed since onset.Pertinent negatives include no chest pain, no abdominal pain, no headaches and no shortness of breath. Nothing relieves the symptoms. She has tried a cold compress (Tramadol, Norco) for the symptoms. The treatment provided no relief.    HPI Comments: Robin Richards is a 38 y.o. female who presents to the Emergency Department complaining of a lesion to the plantar surface of the right foot with constant, burning pain to the same area onset 1 week ago. Pain is worse with weight bearing and ambulation. She has been applying ice to the area and soaking her feet without relief. She has not gotten any relief from pain with ibuprofen. Patient states that she has also been taking Tramadol and Norco that she was prescribed during a prior ED visit without significant improvement. She denies any obvious injury or trauma to the foot. Patient usually wears tennis shoes. She has a PCP and has an appointment scheduled for 05/21/14.   Past Medical History  Diagnosis Date  . Heartburn   . Scoliosis    Past Surgical History  Procedure Laterality Date  . Spine surgery     No family history on file. History  Substance Use Topics  . Smoking status: Current Some Day Smoker -- 0.50 packs/day    Types: Cigarettes  . Smokeless tobacco: Not on file  . Alcohol Use: No   OB History    Grav Para Term Preterm Abortions TAB SAB Ect Mult Living                 Review of Systems  Constitutional: Negative for fever and chills.  Respiratory: Negative for shortness of breath.   Cardiovascular: Negative for chest pain.  Gastrointestinal: Negative for nausea, vomiting and abdominal pain.  Skin: Positive for wound.  Neurological: Negative for weakness, numbness and headaches.    Allergies  Review of patient's allergies indicates no known allergies.  Home Medications   Prior to Admission medications   Medication Sig Start Date End Date Taking? Authorizing Provider  HYDROcodone-acetaminophen (NORCO) 5-325 MG per tablet Take 1 tablet by mouth every 6 (six) hours as needed. 05/02/14   Tatyana A Kirichenko, PA-C  ibuprofen (ADVIL,MOTRIN) 600 MG tablet Take 1 tablet (600 mg total) by mouth every 6 (six) hours as needed. 04/09/14   Julianne Rice, MD  loratadine (CLARITIN) 10 MG tablet Take 1 tablet (10 mg total) by mouth daily. 04/09/14   Julianne Rice, MD  mometasone (NASONEX) 50 MCG/ACT nasal spray Place 2 sprays into the nose daily. 04/09/14   Julianne Rice, MD  oxymetazoline (AFRIN NASAL SPRAY) 0.05 % nasal spray Place 1 spray into both nostrils 2 (two) times daily. 04/09/14   Julianne Rice, MD  traMADol (ULTRAM) 50 MG tablet Take 100 mg by mouth every 6 (six) hours as needed for moderate pain.    Historical  Provider, MD   Triage Vitals: BP 128/85  Pulse 81  Temp(Src) 99 F (37.2 C) (Oral)  Resp 22  Ht 5' 4"  (1.626 m)  Wt 150 lb (68.04 kg)  BMI 25.73 kg/m2  SpO2 97%  LMP 04/07/2014 Physical Exam  Nursing note and vitals reviewed. Constitutional: She is oriented to person, place, and time. She appears well-developed and well-nourished. No distress.  HENT:  Head: Normocephalic and atraumatic.  Eyes: Conjunctivae and EOM are normal.  Neck: Normal range of motion. No tracheal deviation present.  Cardiovascular: Normal rate.   Pulmonary/Chest: Effort normal. No  respiratory distress.  Musculoskeletal: Normal range of motion.  Corn noted on the plantar aspect of her right foot just below the 5th toe. Area is tender to palpation. No drainage. No obvious deformity of the right foot. Mild tenderness to palpation of dorsal right foot over the metatarsal bones.  Neurological: She is alert and oriented to person, place, and time.  Skin: Skin is warm and dry.  Psychiatric: She has a normal mood and affect. Her behavior is normal.    ED Course  Procedures (including critical care time) DIAGNOSTIC STUDIES: Oxygen Saturation is 97% on room air, adequate by my interpretation.    COORDINATION OF CARE: 1:25 PM- Patient informed of current plan for treatment and evaluation and agrees with plan at this time.    MDM   Final diagnoses:  Corn of foot  Right foot pain    1:36 PM Patient has a corn on the plantar aspect of her right foot. Patient likely having foot pain due to altered gait due to pain. Patient will have mobic for pain and bactrim for infection prevention. Vitals stable and patient afebrile. No xray needed at this time.   I personally performed the services described in this documentation, which was scribed in my presence. The recorded information has been reviewed and is accurate.    Alvina Chou, PA-C 05/07/14 1337

## 2014-05-07 NOTE — ED Notes (Signed)
Pt c/o "hole" in palmer surface of right foot. Appears to be wart-like. Also, c/o pain on dorsal surface of foot. No known injury. No deformity. Both onset approximately 2 weeks ago.

## 2014-05-07 NOTE — Discharge Instructions (Signed)
Take Mobic as needed for pain. Take Bactrim as directed to prevent infection. Refer to attached documents for more information. Follow up with your doctor for further evaluation.

## 2014-07-01 ENCOUNTER — Emergency Department (HOSPITAL_COMMUNITY)
Admission: EM | Admit: 2014-07-01 | Discharge: 2014-07-01 | Disposition: A | Discharge status: Home / Self Care | Payer: Medicare Other | Attending: Emergency Medicine | Admitting: Emergency Medicine

## 2014-07-01 ENCOUNTER — Encounter (HOSPITAL_COMMUNITY): Payer: Self-pay | Admitting: Emergency Medicine

## 2014-07-01 DIAGNOSIS — F419 Anxiety disorder, unspecified: Secondary | ICD-10-CM

## 2014-07-01 DIAGNOSIS — R5381 Other malaise: Secondary | ICD-10-CM | POA: Diagnosis present

## 2014-07-01 DIAGNOSIS — Z8739 Personal history of other diseases of the musculoskeletal system and connective tissue: Secondary | ICD-10-CM | POA: Insufficient documentation

## 2014-07-01 DIAGNOSIS — Z3202 Encounter for pregnancy test, result negative: Secondary | ICD-10-CM | POA: Insufficient documentation

## 2014-07-01 DIAGNOSIS — R202 Paresthesia of skin: Secondary | ICD-10-CM

## 2014-07-01 DIAGNOSIS — F17200 Nicotine dependence, unspecified, uncomplicated: Secondary | ICD-10-CM | POA: Insufficient documentation

## 2014-07-01 DIAGNOSIS — F411 Generalized anxiety disorder: Secondary | ICD-10-CM | POA: Diagnosis not present

## 2014-07-01 DIAGNOSIS — R5383 Other fatigue: Secondary | ICD-10-CM

## 2014-07-01 DIAGNOSIS — R209 Unspecified disturbances of skin sensation: Secondary | ICD-10-CM | POA: Insufficient documentation

## 2014-07-01 DIAGNOSIS — IMO0002 Reserved for concepts with insufficient information to code with codable children: Secondary | ICD-10-CM | POA: Insufficient documentation

## 2014-07-01 LAB — URINALYSIS, ROUTINE W REFLEX MICROSCOPIC
Bilirubin Urine: NEGATIVE
GLUCOSE, UA: NEGATIVE mg/dL
Hgb urine dipstick: NEGATIVE
KETONES UR: NEGATIVE mg/dL
Nitrite: NEGATIVE
PROTEIN: NEGATIVE mg/dL
Specific Gravity, Urine: 1.017 (ref 1.005–1.030)
Urobilinogen, UA: 0.2 mg/dL (ref 0.0–1.0)
pH: 6 (ref 5.0–8.0)

## 2014-07-01 LAB — BASIC METABOLIC PANEL
Anion gap: 11 (ref 5–15)
BUN: 12 mg/dL (ref 6–23)
CALCIUM: 9.3 mg/dL (ref 8.4–10.5)
CO2: 26 mEq/L (ref 19–32)
Chloride: 104 mEq/L (ref 96–112)
Creatinine, Ser: 0.59 mg/dL (ref 0.50–1.10)
GFR calc Af Amer: >90 mL/min (ref >90)
GFR calc non Af Amer: >90 mL/min (ref >90)
GLUCOSE: 114 mg/dL — AB (ref 70–99)
Potassium: 4.7 mEq/L (ref 3.7–5.3)
SODIUM: 141 meq/L (ref 137–147)

## 2014-07-01 LAB — URINE MICROSCOPIC-ADD ON

## 2014-07-01 LAB — CBG MONITORING, ED: Glucose-Capillary: 130 mg/dL — ABNORMAL HIGH (ref 70–99)

## 2014-07-01 LAB — PREGNANCY, URINE: Preg Test, Ur: NEGATIVE

## 2014-07-01 NOTE — ED Notes (Signed)
Pt concerned about blood sugar.  Pt sts she had some numbness and tingling to her hands bilaterally earlier today and heaviness to her right arm.  Pt sts she has a hx of arthritis in her body.  Pt also reporting a headache.  No neuro deficits noted.

## 2014-07-01 NOTE — ED Provider Notes (Signed)
CSN: [id number]     Arrival date & time 07/01/14  1809 History   First MD Initiated Contact with Patient 07/01/14 2112     Chief Complaint  Patient presents with  . Headache  . Fatigue      HPI Pt was seen at 2150. Per pt, c/o gradual onset and persistence of constant anxiety for the past several days. Pt states she has been "worried about my blood sugar" and checked it yesterday. Pt states her CBG yesterday was "144." Pt states after she checked her CBG she felt "tired" with "tingling/numbness" around her lips and all her fingertips. Pt states she feels improved today. States she is "still worried about my sugar." Pt states she has spoken to her PMD regarding same, and states her "doctor is running more tests." States her PMD did not diagnose her with DM. Denies fevers, no CP/SOB, no abd pain, no N/V/D, no back pain, no focal motor weakness.    Past Medical History  Diagnosis Date  . Heartburn   . Scoliosis    Past Surgical History  Procedure Laterality Date  . Spine surgery      History  Substance Use Topics  . Smoking status: Current Some Day Smoker -- 0.50 packs/day    Types: Cigarettes  . Smokeless tobacco: Not on file  . Alcohol Use: No    Review of Systems ROS: Statement: All systems negative except as marked or noted in the HPI; Constitutional: Negative for fever and chills. ; ; Eyes: Negative for eye pain, redness and discharge. ; ; ENMT: Negative for ear pain, hoarseness, nasal congestion, sinus pressure and sore throat. ; ; Cardiovascular: Negative for chest pain, palpitations, diaphoresis, dyspnea and peripheral edema. ; ; Respiratory: Negative for cough, wheezing and stridor. ; ; Gastrointestinal: Negative for nausea, vomiting, diarrhea, abdominal pain, blood in stool, hematemesis, jaundice and rectal bleeding. . ; ; Genitourinary: Negative for dysuria, flank pain and hematuria. ; ; Musculoskeletal: Negative for back pain and neck pain. Negative for swelling and trauma.;  ; Skin: Negative for pruritus, rash, abrasions, blisters, bruising and skin lesion.; ; Neuro: +paresthesias. Negative for headache, lightheadedness and neck stiffness. Negative for weakness, altered level of consciousness , altered mental status, extremity weakness, involuntary movement, seizure and syncope. Psych:  +anxiety. No SI, no SA, no HI, no hallucinations.     Allergies  Review of patient's allergies indicates no known allergies.  Home Medications   Prior to Admission medications   Medication Sig Start Date End Date Taking? Authorizing Provider  albuterol (PROVENTIL HFA;VENTOLIN HFA) 108 (90 BASE) MCG/ACT inhaler Inhale 1-2 puffs into the lungs every 6 (six) hours as needed for wheezing or shortness of breath.   Yes Historical Provider, MD  ibuprofen (ADVIL,MOTRIN) 600 MG tablet Take 600 mg by mouth every 6 (six) hours as needed for fever, headache or mild pain. 04/09/14  Yes Julianne Rice, MD  loratadine (CLARITIN) 10 MG tablet Take 10 mg by mouth daily as needed for allergies.   Yes Historical Provider, MD  meloxicam (MOBIC) 15 MG tablet Take 15 mg by mouth daily as needed for pain.   Yes Historical Provider, MD  mometasone (NASONEX) 50 MCG/ACT nasal spray Place 2 sprays into the nose daily. 04/09/14  Yes Julianne Rice, MD  traMADol (ULTRAM) 50 MG tablet Take 100 mg by mouth every 6 (six) hours as needed for moderate pain.   Yes Historical Provider, MD   BP 126/81  Pulse 78  Temp(Src) 98.5 F (36.9 C) (Oral)  Resp 18  Ht 5' 4"  (1.626 m)  Wt 152 lb (68.947 kg)  BMI 26.08 kg/m2  SpO2 100%  LMP 06/24/2014 Physical Exam 2155: Physical examination:  Nursing notes reviewed; Vital signs and O2 SAT reviewed;  Constitutional: Well developed, Well nourished, Well hydrated, In no acute distress; Head:  Normocephalic, atraumatic; Eyes: EOMI, PERRL, No scleral icterus; ENMT: Mouth and pharynx normal, Mucous membranes moist; Neck: Supple, Full range of motion, No lymphadenopathy;  Cardiovascular: Regular rate and rhythm, No murmur, rub, or gallop; Respiratory: Breath sounds clear & equal bilaterally, No rales, rhonchi, wheezes.  Speaking full sentences with ease, Normal respiratory effort/excursion; Chest: Nontender, Movement normal; Abdomen: Soft, Nontender, Nondistended, Normal bowel sounds; Genitourinary: No CVA tenderness; Extremities: Pulses normal, No tenderness, No edema, No calf edema or asymmetry.; Neuro: AA&Ox3, Major CN grossly intact. Speech clear.  No facial droop.  No nystagmus. Grips equal. Strength 5/5 equal bilat UE's and LE's.  DTR 2/4 equal bilat UE's and LE's.  No gross sensory deficits.  Normal cerebellar testing bilat UE's (finger-nose) and LE's (heel-shin). Climbs on and off stretcher easily by herself. Gait steady.; Skin: Color normal, Warm, Dry.   ED Course  Procedures     MDM  MDM Reviewed: previous chart, nursing note and vitals Reviewed previous: labs Interpretation: labs    Results for orders placed during the hospital encounter of 69/79/48  BASIC METABOLIC PANEL      Result Value Ref Range   Sodium 141  137 - 147 mEq/L   Potassium 4.7  3.7 - 5.3 mEq/L   Chloride 104  96 - 112 mEq/L   CO2 26  19 - 32 mEq/L   Glucose, Bld 114 (*) 70 - 99 mg/dL   BUN 12  6 - 23 mg/dL   Creatinine, Ser 0.59  0.50 - 1.10 mg/dL   Calcium 9.3  8.4 - 10.5 mg/dL   GFR calc non Af Amer >90  >90 mL/min   GFR calc Af Amer >90  >90 mL/min   Anion gap 11  5 - 15  URINALYSIS, ROUTINE W REFLEX MICROSCOPIC      Result Value Ref Range   Color, Urine YELLOW  YELLOW   APPearance CLOUDY (*) CLEAR   Specific Gravity, Urine 1.017  1.005 - 1.030   pH 6.0  5.0 - 8.0   Glucose, UA NEGATIVE  NEGATIVE mg/dL   Hgb urine dipstick NEGATIVE  NEGATIVE   Bilirubin Urine NEGATIVE  NEGATIVE   Ketones, ur NEGATIVE  NEGATIVE mg/dL   Protein, ur NEGATIVE  NEGATIVE mg/dL   Urobilinogen, UA 0.2  0.0 - 1.0 mg/dL   Nitrite NEGATIVE  NEGATIVE   Leukocytes, UA TRACE (*) NEGATIVE   PREGNANCY, URINE      Result Value Ref Range   Preg Test, Ur NEGATIVE  NEGATIVE  URINE MICROSCOPIC-ADD ON      Result Value Ref Range   Squamous Epithelial / LPF MANY (*) RARE   WBC, UA 0-2  <3 WBC/hpf   RBC / HPF 0-2  <3 RBC/hpf   Bacteria, UA RARE  RARE  CBG MONITORING, ED      Result Value Ref Range   Glucose-Capillary 130 (*) 70 - 99 mg/dL    2210:   Pt is not orthostatic. Workup reassuring, d/w pt regarding same. Pt states she "feels better now" and wants to go home. Dx and testing d/w pt.  Questions answered.  Verb understanding, agreeable to d/c home with outpt f/u.     Francine Graven,  DO 07/04/14 1321

## 2014-07-01 NOTE — ED Notes (Signed)
Attempted to draw pts labs was unsuccessful called phlebotomy and spoke with Tyrone.

## 2014-07-01 NOTE — Discharge Instructions (Signed)
°Emergency Department Resource Guide °1) Find a Doctor and Pay Out of Pocket °Although you won't have to find out who is covered by your insurance plan, it is a good idea to ask around and get recommendations. You will then need to call the office and see if the doctor you have chosen will accept you as a new patient and what types of options they offer for patients who are self-pay. Some doctors offer discounts or will set up payment plans for their patients who do not have insurance, but you will need to ask so you aren't surprised when you get to your appointment. ° °2) Contact Your Local Health Department °Not all health departments have doctors that can see patients for sick visits, but many do, so it is worth a call to see if yours does. If you don't know where your local health department is, you can check in your phone book. The CDC also has a tool to help you locate your state's health department, and many state websites also have listings of all of their local health departments. ° °3) Find a Walk-in Clinic °If your illness is not likely to be very severe or complicated, you may want to try a walk in clinic. These are popping up all over the country in pharmacies, drugstores, and shopping centers. They're usually staffed by nurse practitioners or physician assistants that have been trained to treat common illnesses and complaints. They're usually fairly quick and inexpensive. However, if you have serious medical issues or chronic medical problems, these are probably not your best option. ° °No Primary Care Doctor: °- Call [facility] at  832-8000 - they can help you locate a primary care doctor that  accepts your insurance, provides certain services, etc. °- Physician Referral Service- 1-800-533-3463 ° °Chronic Pain Problems: °Organization         Address  Phone   Notes  °[facility]  (336) 297-2271 Patients need to be referred by their primary care doctor.  ° °Medication  Assistance: °Organization         Address  Phone   Notes  °[place] [facility] [address] °[place], [place] [address] (336) 641-8030 --Must be a resident of [place] °-- Must have NO insurance coverage whatsoever (no Medicaid/ Medicare, etc.) °-- The pt. MUST have a primary care doctor that directs their care regularly and follows them in the community °  °[facility]  (866) 331-1348   °[employer]  (888) 892-1162   ° °Agencies that provide inexpensive medical care: °Organization         Address  Phone   Notes  °[facility]  (336) 832-8035   °[facility]    (336) 832-7272   °[facility] [address] °[place], [place] [address] (336) 832-4777   °[facility] [address], °[place] (336) 271-4999   °[facility]    (336) 373-0678   °[facility]    (336) 272-1050   °[facility] ° [address], [place] Phone:  (336) 832-4444, Fax:  (336) 832-4440 Hours of Operation:  9 am - 6 pm, M-F.  Also accepts Medicaid/Medicare and self-pay.  °[facility] ° [address], [place] Phone: (336) 832-3150, Fax: (336) [phone]. Hours of Operation:  8:30 am - 5:30 pm, M-F.  Also accepts Medicaid and self-pay.  °[facility] [place] [address], [place] Phone: (336) 878-6027   °[facility] [address], [place], [place] (336)723-1848, Ext. 123 Mondays & Thursdays: 7-9 AM.  First 15 patients are seen on a first come, first serve basis. °  ° °Medicaid-accepting [place] Providers: ° °Organization         Address  Phone   Notes  °[facility] [address], [place] (336) 641-2100 Also accepts self-pay patients.  °[facility] [address], [place] ° (336) 856-9996   °[facility] [address], [place]  (336) 288-8857   °[facility] [address] [place] [address], [place] (336) 299-7000   °Veita Bland [address], [place]  ° (336) 373-1557 Only accepts [employer] Access Medicaid patients after they have their name applied to their card.  ° °Self-Pay (no insurance) in [place]: ° °Organization         Address  Phone   Notes  °Sickle Cell Patients, [facility] [address], [place] (336) 832-1970   °[facility] [address], [place] (336) 832-4400   °[facility] ° [address], [place] (336) 992-4800   °[facility]/Dr. Osei-Bonsu ° [address] [place] [address], [place] or [address], [place] (336) 841-8500 Phone number for both [place] and [place] locations is the same.  °[facility] [address], [place] (336) 299-0000   °[facility] [address] [place] [address], [place] or [address] (336) 852-7530 °(336) 878-2260   °[facility] [address], [place] (336) 350-1642, phone; (336) 294-5005, fax Sees patients 1st and 3rd Saturday of every month.  Must not qualify for public or private insurance (i.e. Medicaid, Medicare, [place] Health Choice, Veterans' Benefits) • Household income should be no more than 200% of the poverty level •The clinic cannot treat you if you are pregnant or think you are pregnant • Sexually transmitted diseases are not treated at the clinic.  ° ° °Dental Care: °Organization         Address  Phone  Notes  °[place] [facility] [address], [place] (336) 641-6152 Accepts children up to age 21 who are enrolled in Medicaid or [place] Health Choice; pregnant women with a Medicaid card; and children who have applied for Medicaid or [place] Health Choice, but were declined, whose parents can pay a reduced fee at time of service.  °[place]  [facility] [place]  [address], [place] (336) 641-7733 Accepts children up to age 21 who are enrolled in Medicaid or [place] Health Choice; pregnant women with a Medicaid card; and children who have applied for Medicaid or [place] Health Choice, but were declined, whose parents can pay a reduced fee at time of service.  °[facility] ° [address],  (336) 641-4533 Patients are seen by appointment only. Walk-ins are not accepted. [facility] will see patients 18 years of age and older. °Monday - Tuesday (8am-5pm) °Most Wednesdays (8:30-5pm) °$30 per visit, cash only  °[facility] ° [address], [place] (336) 641-4533 Patients are seen by appointment only. Walk-ins are not accepted. [facility] will see patients 18 years of age and older. °One   Wednesday Evening (Monthly: Volunteer Based).  $30 per visit, cash only  °[facility]  (919) 537-3737 for adults; Children under age 4, call Graduate Pediatric Dentistry at (919) [phone]. Children aged 4-14, please call (919) [phone] to request a pediatric application. ° Dental services are provided in all areas of dental care including fillings, crowns and bridges, complete and partial dentures, implants, gum treatment, root canals, and extractions. Preventive care is also provided. Treatment is provided to both adults and children. °Patients are selected via a lottery and there is often a waiting list. °  °[facility] [address], °[place] ° (336) 763-8833 [email or web address].drcivils.[email or web address] °  °[facility] [address], [place], [place] (336)723-1848, Ext. 123 Second and Fourth Thursday of each month, opens at 6:30 AM; Clinic ends at 9 AM.  Patients are seen on a first-come first-served basis, and a limited number are seen during each clinic.  ° °[facility] ° [address], [place], [place] (336) 723-7904    Eligibility Requirements °You must have lived in [place], or [place] counties for at least the last three months. °  You cannot be eligible for state or federal sponsored healthcare insurance, including Veterans Administration, Medicaid, or Medicare. °  You generally cannot be eligible for healthcare insurance through your employer.  °  How to apply: °Eligibility screenings are held every Tuesday and Wednesday afternoon from 1:00 pm until 4:00 pm. You do not need an appointment for the interview!  °[facility] [address], [place], [place] 336-631-2330   °[facility]  336-342-8273   °[facility]  336-703-3100   °[facility]  336-570-6415   ° °Behavioral Health Resources in the Community: °Intensive Outpatient Programs °Organization         Address  Phone  Notes  °[facility] [address], [place], [place] 336-878-6098   °[facility] [address], [place], [place] 336-832-9800   °[facility] [address], [place], [place] ° 336-882-2125   °[facility] [address],  °[place], [place] 1-800-853-5163 or 336-641-4981   °Substance Abuse Resources °Organization         Address  Phone  Notes  °[facility]  336-882-2125   °[facility]  336-784-9470   °[facility]  336-285-9073   °[facility]  336-845-3988   °Residential & Outpatient Substance Abuse Program  1-800-659-3381   °Psychological Services °Organization         Address  Phone  Notes  °[facility]  336- 832-9600   °[facility]  336- 378-7881   °[facility] [address], [place] 1-800-853-5163 or 336-641-4981   ° °Mobile Crisis Teams °Organization         Address  Phone  Notes  °[facility], Mobile Crisis Care Unit  1-877-626-1772   °Assertive °[facility] ° [address]  [place], [place] 336-834-9664   °Sharon DeEsch [address] °[place] 336-554-5454   ° °Self-Help/Support Groups °Organization         Address  Phone             Notes  °[facility] - variety of support groups  336- 373-1402 Call for more information  °Narcotics Anonymous (NA), [facility] [address], °[place]  2 meetings at this location  ° °  Residential Treatment Programs Organization         Address  Phone  Notes  [facility] [address],    [place]  1-249-171-0138   [facility]  [address] [place] [address], [place], [place]   [place], [place] 254-692-8315 Admissions: 8am-3pm M-F  [facility] [place] [address]    [place], [place] [phone]   [facility] [address] [place], [place], [place]   [facility] [address],  [place], [place]   [facility] Intensive Outpatient [address], [place], [place]   [facility] [address]  [place], [place] 1-(737)190-6816 or 940-158-0970   [facility] [address], [place], [place] Accepts Medicaid  [facility] [address]  [place] 1-7790050059 Substance Abuse/Addiction Treatment   [facility] Organization         Address  Phone  Notes  [facility]  [phone]   Domenic Schwab, PhD [address] [place], [place]   [phone] or [phone]   [facility] [place], [place] 253-041-8285   [facility] [address], [place], [place] [phone] Insurance/Medicaid/sponsorship through [facility] [address]                                    [place], [place] [phone] [place] [address], [place] [phone]    Dr. Adele Schilder  [phone]   [facility] Dept. 1) [address], [place] 2) [place] 3)  [address], [place] [phone]  [phone]   [place] (640)380-0040 or (505) [phone] (After Hours)       Take your usual prescriptions as previously directed.  Call your regular medical doctor tomorrow to schedule a follow up appointment within the next 2 days. Return to the Emergency Department immediately sooner if worsening.

## 2014-07-01 NOTE — ED Notes (Addendum)
Pt sts she thinks her blood sugar is high, c/o generalized fatigue, HA and pin and needles in bilateral hands since yesterday. sts her blood sugar yesterday was 144, had it checked at her PCP. Reports she doesn't check it at home because she doesn't have a way to check it at home. sts she hasn't been diagnosed with DM yet but her pcp has been working her up for it. Nad, skin warm and dry, resp e/u.

## 2015-01-18 ENCOUNTER — Encounter (HOSPITAL_COMMUNITY): Payer: Self-pay | Admitting: Emergency Medicine

## 2015-01-18 ENCOUNTER — Emergency Department (HOSPITAL_COMMUNITY)
Admission: EM | Admit: 2015-01-18 | Discharge: 2015-01-18 | Disposition: A | Discharge status: Home / Self Care | Payer: Medicare Other | Attending: Emergency Medicine | Admitting: Emergency Medicine

## 2015-01-18 DIAGNOSIS — M25511 Pain in right shoulder: Secondary | ICD-10-CM | POA: Diagnosis present

## 2015-01-18 DIAGNOSIS — M542 Cervicalgia: Secondary | ICD-10-CM | POA: Insufficient documentation

## 2015-01-18 DIAGNOSIS — Z79899 Other long term (current) drug therapy: Secondary | ICD-10-CM | POA: Diagnosis not present

## 2015-01-18 DIAGNOSIS — Z7952 Long term (current) use of systemic steroids: Secondary | ICD-10-CM | POA: Diagnosis not present

## 2015-01-18 DIAGNOSIS — H938X3 Other specified disorders of ear, bilateral: Secondary | ICD-10-CM | POA: Insufficient documentation

## 2015-01-18 DIAGNOSIS — Z720 Tobacco use: Secondary | ICD-10-CM | POA: Diagnosis not present

## 2015-01-18 DIAGNOSIS — Z791 Long term (current) use of non-steroidal anti-inflammatories (NSAID): Secondary | ICD-10-CM | POA: Insufficient documentation

## 2015-01-18 DIAGNOSIS — R200 Anesthesia of skin: Secondary | ICD-10-CM | POA: Insufficient documentation

## 2015-01-18 MED ORDER — METHOCARBAMOL 500 MG PO TABS: 500.0000 mg | ORAL_TABLET | Freq: Three times a day (TID) | ORAL | Status: DC | PRN

## 2015-01-18 MED ORDER — MELOXICAM 15 MG PO TABS: 15.0000 mg | ORAL_TABLET | Freq: Every day | ORAL | Status: DC | PRN

## 2015-01-18 NOTE — Discharge Instructions (Signed)
RICE: Routine Care for Injuries The routine care of many injuries includes Rest, Ice, Compression, and Elevation (RICE). HOME CARE INSTRUCTIONS  Rest is needed to allow your body to heal. Routine activities can usually be resumed when comfortable. Injured tendons and bones can take up to 6 weeks to heal. Tendons are the cord-like structures that attach muscle to bone.  Ice following an injury helps keep the swelling down and reduces pain.  Put ice in a plastic bag.  Place a towel between your skin and the bag.  Leave the ice on for 15-20 minutes, 3-4 times a day, or as directed by your health care provider. Do this while awake, for the first 24 to 48 hours. After that, continue as directed by your caregiver.  Compression helps keep swelling down. It also gives support and helps with discomfort. If an elastic bandage has been applied, it should be removed and reapplied every 3 to 4 hours. It should not be applied tightly, but firmly enough to keep swelling down. Watch fingers or toes for swelling, bluish discoloration, coldness, numbness, or excessive pain. If any of these problems occur, remove the bandage and reapply loosely. Contact your caregiver if these problems continue.  Elevation helps reduce swelling and decreases pain. With extremities, such as the arms, hands, legs, and feet, the injured area should be placed near or above the level of the heart, if possible. SEEK IMMEDIATE MEDICAL CARE IF:  You have persistent pain and swelling.  You develop redness, numbness, or unexpected weakness.  Your symptoms are getting worse rather than improving after several days. These symptoms may indicate that further evaluation or further X-rays are needed. Sometimes, X-rays may not show a small broken bone (fracture) until 1 week or 10 days later. Make a follow-up appointment with your caregiver. Ask when your X-ray results will be ready. Make sure you get your X-ray results. Document Released:  02/25/2001 Document Revised: 11/18/2013 Document Reviewed: 04/14/2011 ExitCare Patient Information 2015 [employer]. This information is not intended to replace advice given to you by your health care provider. Make sure you discuss any questions you have with your health care provider.  

## 2015-01-18 NOTE — ED Provider Notes (Signed)
CSN: [id number]     Arrival date & time 01/18/15  1144 History   This chart was scribed for non-physician practitioner, Domenic Moras, PA-C, working with Blanchie Dessert, MD by Ladene Artist, ED Scribe. This patient was seen in room TR07C/TR07C and the patient's care was started at 1:14 PM.   Chief Complaint  Patient presents with  . Shoulder Pain   The history is provided by the patient. No language interpreter was used.   HPI Comments: Robin Richards is a 39 y.o. female, with a h/o scoliosis, who presents to the Emergency Department complaining of persistent R shoulder pain that radiates into R neck onset 3 days ago. Pt describes pain as a constant, 8/10 pulling sensation. She states that she might have sleep funny but is not sure. Pain is exacerbated with lifting. She denies trauma or fall. Pt reports h/o R broken scapula as a child but denies associated pain since. She reports associated numbness in R upper arm. 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 Pt is R hand dominant. No h/o CA, recent travel or recent surgery. She is a smoker for 15 years; smokes 2-3 cigarettes daily.   Pt also presents with bilateral ear discharge since yesterday. Pt reports using her finger to scratch the inside of her ear yesterday prior to onset of discharge. She states that drainage started in her L ear a few minutes before the R. Pt reports associated dull, 4/10 pain in the L ear. Pt denies bleeding, fever, dizziness, light-headedness, SOB, chest pain, abdominal pain, diarrhea, vomiting, visual disturbances, hearing loss.   Past Medical History  Diagnosis Date  . Heartburn   . Scoliosis    Past Surgical History  Procedure Laterality Date  . Spine surgery     No family history on file. History  Substance Use Topics  . Smoking status: Current Some Day Smoker -- 0.50 packs/day    Types: Cigarettes  . Smokeless tobacco: Not on file  . Alcohol Use: No   OB  History    No data available     Review of Systems  Constitutional: Negative for fever.  HENT: Positive for ear discharge and ear pain. Negative for hearing loss.   Eyes: Negative for visual disturbance.  Respiratory: Negative for shortness of breath.   Cardiovascular: Negative for chest pain.  Gastrointestinal: Negative for vomiting, abdominal pain and diarrhea.  Musculoskeletal: Positive for arthralgias and neck pain.  Neurological: Positive for numbness. Negative for dizziness and light-headedness.   Allergies  Review of patient's allergies indicates no known allergies.  Home Medications   Prior to Admission medications   Medication Sig Start Date End Date Taking? Authorizing Provider  albuterol (PROVENTIL HFA;VENTOLIN HFA) 108 (90 BASE) MCG/ACT inhaler Inhale 1-2 puffs into the lungs every 6 (six) hours as needed for wheezing or shortness of breath.    Historical Provider, MD  ibuprofen (ADVIL,MOTRIN) 600 MG tablet Take 600 mg by mouth every 6 (six) hours as needed for fever, headache or mild pain. 04/09/14   Julianne Rice, MD  loratadine (CLARITIN) 10 MG tablet Take 10 mg by mouth daily as needed for allergies.    Historical Provider, MD  meloxicam (MOBIC) 15 MG tablet Take 15 mg by mouth daily as needed for pain.    Historical Provider, MD  mometasone (NASONEX) 50 MCG/ACT nasal spray Place 2 sprays into the nose daily. 04/09/14   Julianne Rice, MD  traMADol (ULTRAM) 50 MG tablet Take 100 mg by  mouth every 6 (six) hours as needed for moderate pain.    Historical Provider, MD   BP 111/82 mmHg  Pulse 86  Temp(Src) 98.5 F (36.9 C) (Oral)  SpO2 99% Physical Exam  Constitutional: She is oriented to person, place, and time. She appears well-developed and well-nourished. No distress.  HENT:  Head: Normocephalic and atraumatic.  Right Ear: Tympanic membrane normal. Tympanic membrane is not erythematous.  Left Ear: Tympanic membrane normal. Tympanic membrane is not erythematous.   Eyes: Conjunctivae and EOM are normal.  Neck: Normal range of motion. Neck supple. No tracheal deviation present.  Full ROM of neck. No pain until base of neck.   Cardiovascular: Normal rate and normal pulses.   Pulmonary/Chest: Effort normal. No respiratory distress. She exhibits no tenderness.  Musculoskeletal: Normal range of motion.  R shoulder with decreased abduction, adduction and rotation secondary to pain. No gross deformity. No rash. Tenderness to to R trapezius muscles to palpation. Pt is able to lift R arm but resists due to pain. Full ROM. Pt can feel palpation down R arm. Normal elbow and normal wrist. Normal grip strength.   Neurological: She is alert and oriented to person, place, and time.  Skin: Skin is warm and dry.  Psychiatric: She has a normal mood and affect. Her behavior is normal.  Nursing note and vitals reviewed.  ED Course  Procedures (including critical care time) DIAGNOSTIC STUDIES: Oxygen Saturation is 99% on RA, normal by my interpretation.    COORDINATION OF CARE: 1:18 PM-sxs is muscoloskeletal. DOubt cardiopulmonary cause.  No signs of infection, no signs of injury.  Discussed treatment plan which includes Mobic and Robaxin with pt at bedside and pt agreed to plan.   Labs Review Labs Reviewed - No data to display  Imaging Review No results found.   EKG Interpretation None      MDM   Final diagnoses:  Right shoulder pain   BP 111/82 mmHg  Pulse 86  Temp(Src) 98.5 F (36.9 C) (Oral)  SpO2 99%  I personally performed the services described in this documentation, which was scribed in my presence. The recorded information has been reviewed and is accurate.     Domenic Moras, PA-C 01/18/15 1321  Blanchie Dessert, MD 01/18/15 1556

## 2015-01-18 NOTE — ED Notes (Signed)
Started 3 days ago with right neck and shoulder pain. No known injury. ALSO, c/o bilateral "ears draining" x 1 day.

## 2015-02-23 ENCOUNTER — Encounter: Payer: Self-pay | Admitting: *Deleted

## 2015-03-05 ENCOUNTER — Encounter: Payer: Medicare Other | Admitting: Family Medicine

## 2015-03-05 ENCOUNTER — Encounter: Payer: Self-pay | Admitting: Family Medicine

## 2015-03-05 NOTE — Progress Notes (Signed)
Patient states her menstrual cycle started last night and is heavy- changing pad every 40mnutes to one hour.  Informed her we can not do pap smear today, will need to reschedule for when not on cycle.  Discussed with Dr. SNehemiah Settle in agreement with plan.

## 2015-03-06 NOTE — Progress Notes (Signed)
Patient rescheduled

## 2015-04-09 ENCOUNTER — Ambulatory Visit (INDEPENDENT_AMBULATORY_CARE_PROVIDER_SITE_OTHER): Payer: Medicare Other | Admitting: Obstetrics & Gynecology

## 2015-04-09 ENCOUNTER — Encounter: Payer: Self-pay | Admitting: Obstetrics & Gynecology

## 2015-04-09 ENCOUNTER — Other Ambulatory Visit (HOSPITAL_COMMUNITY)
Admission: RE | Admit: 2015-04-09 | Discharge: 2015-04-09 | Disposition: A | Discharge status: Home / Self Care | Payer: Medicare Other | Source: Ambulatory Visit | Attending: Obstetrics & Gynecology | Admitting: Obstetrics & Gynecology

## 2015-04-09 VITALS — BP 110/80 | HR 80 | Temp 98.4°F | Ht 63.0 in | Wt 146.4 lb

## 2015-04-09 DIAGNOSIS — Z124 Encounter for screening for malignant neoplasm of cervix: Secondary | ICD-10-CM

## 2015-04-09 DIAGNOSIS — Z01419 Encounter for gynecological examination (general) (routine) without abnormal findings: Secondary | ICD-10-CM | POA: Insufficient documentation

## 2015-04-09 DIAGNOSIS — R7303 Prediabetes: Secondary | ICD-10-CM | POA: Insufficient documentation

## 2015-04-09 DIAGNOSIS — Z1151 Encounter for screening for human papillomavirus (HPV): Secondary | ICD-10-CM | POA: Diagnosis not present

## 2015-04-09 DIAGNOSIS — Z1231 Encounter for screening mammogram for malignant neoplasm of breast: Secondary | ICD-10-CM

## 2015-04-09 NOTE — Progress Notes (Signed)
   Subjective:    Patient ID: Robin Richards, female    DOB: 12/28/1975, 39 y.o.   MRN: [id number]  HPI Patient presents for annual well woman exam. She reports last pap in 2010 was normal. She denies any history of abnormal pap smears or STIs. She occasional feels her breasts and has not noted any lumps or irregularities.   She is followed by Dr. Jonelle Sidle for her diabetes and arthritis. She has no other medical problems. No FH of gynecological cancers. She is a 1ppd current smoker.   Review of Systems  Constitutional: Negative for fever.  Respiratory: Positive for cough.   Cardiovascular: Negative for chest pain.  Gastrointestinal: Negative for nausea, vomiting, abdominal pain and diarrhea.  Genitourinary: Negative for dysuria, vaginal discharge, genital sores, vaginal pain, menstrual problem and pelvic pain.  Musculoskeletal: Positive for arthralgias.       Objective:   Physical Exam  Constitutional: She is oriented to person, place, and time. She appears well-developed and well-nourished. No distress.  Somewhat disheveled, appears older than stated age  HENT:  Head: Normocephalic and atraumatic.  Eyes: Conjunctivae are normal. Right eye exhibits no discharge. Left eye exhibits no discharge.  Cardiovascular: Normal rate, regular rhythm, normal heart sounds and intact distal pulses.   No murmur heard. Pulmonary/Chest: Effort normal and breath sounds normal. No respiratory distress. She has no wheezes.  Abdominal: Soft. She exhibits no distension and no mass. There is no tenderness. There is no rebound and no guarding.  Genitourinary: Vagina normal and uterus normal. No breast swelling, tenderness, discharge or bleeding. No labial fusion. There is no rash, tenderness, lesion or injury on the right labia. There is no rash, tenderness, lesion or injury on the left labia. Uterus is not deviated, not enlarged, not fixed and not tender. Cervix exhibits friability. Cervix exhibits no motion  tenderness and no discharge. Right adnexum displays no mass, no tenderness and no fullness. Left adnexum displays no mass, no tenderness and no fullness. No erythema, tenderness or bleeding in the vagina. No foreign body around the vagina. No signs of injury around the vagina. No vaginal discharge found.  Mild irregularities on cervix near os, slightly friable with small flesh colored bumps  Neurological: She is alert and oriented to person, place, and time.  Skin: Skin is warm and dry. No rash noted. She is not diaphoretic.  Psychiatric: She has a normal mood and affect. Her behavior is normal.  Nursing note and vitals reviewed.         Assessment & Plan:  Cervical cancer screening: Pap w/ cotest performed today. Slight surface irregularities in patient who smokes. Patient counseled to stop smoking. Call patient with pap result and f/u depending on result. If negative with negative HPV will need repeat cotest in 5 years.  Breast cancer screening: Normal breast exam today. Screening mammogram scheduled later this month.  Smoking: Patient smokes 1 ppd and is precontemplative in stages of change model. I have advised her to consider quitting at this will reduce her risk of cervical and other cancers, as well as heart and vascular disease.

## 2015-04-09 NOTE — Patient Instructions (Signed)

## 2015-04-12 LAB — CYTOLOGY - PAP

## 2015-04-16 ENCOUNTER — Ambulatory Visit (HOSPITAL_COMMUNITY)
Admission: RE | Admit: 2015-04-16 | Discharge: 2015-04-16 | Disposition: A | Discharge status: Home / Self Care | Payer: Medicare Other | Source: Ambulatory Visit | Attending: Obstetrics & Gynecology | Admitting: Obstetrics & Gynecology

## 2015-04-16 DIAGNOSIS — Z1231 Encounter for screening mammogram for malignant neoplasm of breast: Secondary | ICD-10-CM | POA: Insufficient documentation

## 2015-05-24 ENCOUNTER — Other Ambulatory Visit: Payer: Self-pay

## 2015-07-15 ENCOUNTER — Ambulatory Visit: Payer: Medicare Other | Attending: Internal Medicine | Admitting: Physical Therapy

## 2015-07-15 DIAGNOSIS — M7582 Other shoulder lesions, left shoulder: Secondary | ICD-10-CM | POA: Diagnosis present

## 2015-07-15 DIAGNOSIS — M25511 Pain in right shoulder: Secondary | ICD-10-CM

## 2015-07-15 DIAGNOSIS — M25611 Stiffness of right shoulder, not elsewhere classified: Secondary | ICD-10-CM

## 2015-07-15 DIAGNOSIS — M25612 Stiffness of left shoulder, not elsewhere classified: Secondary | ICD-10-CM

## 2015-07-15 DIAGNOSIS — R293 Abnormal posture: Secondary | ICD-10-CM | POA: Diagnosis present

## 2015-07-15 DIAGNOSIS — R29898 Other symptoms and signs involving the musculoskeletal system: Secondary | ICD-10-CM | POA: Diagnosis present

## 2015-07-15 NOTE — Therapy (Signed)
[place], [place], [address] Phone: [phone]   Fax:  [phone]  Physical Therapy Evaluation  Patient Details  Name: Robin Richards MRN: [id number] Date of Birth: [birth year] Referring Provider:  Elwyn Reach, MD  Encounter Date: 07/15/2015      PT End of Session - 07/15/15 1013    Visit Number 1   Number of Visits 12   Date for PT Re-Evaluation 08/26/15   PT Start Time 0930   PT Stop Time 1015   PT Time Calculation (min) 45 min   Activity Tolerance Patient tolerated treatment well   Behavior During Therapy [facility] for tasks assessed/performed      Past Medical History  Diagnosis Date  . Heartburn   . Scoliosis   . Arthritis     Past Surgical History  Procedure Laterality Date  . Spine surgery      There were no vitals filed for this visit.  Visit Diagnosis:  Pain in joint, shoulder region, right - Plan: PT plan of care cert/re-cert  Weakness of both arms - Plan: PT plan of care cert/re-cert  Decreased ROM of right shoulder - Plan: PT plan of care cert/re-cert  Decreased ROM of left shoulder - Plan: PT plan of care cert/re-cert  Abnormal posture - Plan: PT plan of care cert/re-cert      Subjective Assessment - 07/15/15 0935    Subjective pt is a 39 y.o F with CC of R shoulder pain that has been going on for 14 days insidously. since onset the pain has gotten worse. She reports numbness going into the hand. pt reports no previous injuries with her R shoulder   Limitations Lifting;House hold activities   How long can you sit comfortably? 5-10 min   How long can you stand comfortably? 30 min   How long can you walk comfortably? 30 min   Diagnostic tests x-ray 07/13/2015 pt reports the x-ray looked normal    Patient Stated Goals to be pain free, to make the arm work   Currently in Pain? Yes   Pain Score 7    Pain Location Shoulder   Pain Orientation Right   Pain Descriptors /  Indicators Aching   Pain Type --  sub-acute   Pain Radiating Towards numbness into the R hand   Pain Onset 1 to 4 weeks ago   Pain Frequency Constant   Aggravating Factors  lifting, reaching, carrying   Pain Relieving Factors naproxen, ice            [facility] PT Assessment - 07/15/15 0942    Assessment   Medical Diagnosis R shoulder pain   Onset Date/Surgical Date --  06/28/2015   Hand Dominance Right   Next MD Visit --  07/27/2015   Prior Therapy no   Precautions   Precautions None   Restrictions   Weight Bearing Restrictions No   Balance Screen   Has the patient fallen in the past 6 months No   Has the patient had a decrease in activity level because of a fear of falling?  No   Is the patient reluctant to leave their home because of a fear of falling?  No   Home Environment   Living Environment Private residence   Living Arrangements Alone   Type of [place] to enter   Entrance Stairs-Number of Steps 2   Entrance Stairs-Rails None   Home Layout One level   Prior  Function   Level of Independence Independent;Independent with basic ADLs   Vocation On disability   Leisure watching football, going to beach, watch tv   Cognition   Overall Cognitive Status Within Functional Limits for tasks assessed   Observation/Other Assessments   Focus on Therapeutic Outcomes (FOTO)  65% limited  predicted 36%    Posture/Postural Control   Posture/Postural Control Postural limitations   Postural Limitations Rounded Shoulders;Forward head  R sided thoracic scoliosis   ROM / Strength   AROM / PROM / Strength AROM;PROM;Strength   AROM   AROM Assessment Site Shoulder   Right/Left Shoulder Right;Left   Right Shoulder Extension 46 Degrees  pain during movement   Right Shoulder Flexion 82 Degrees  pain during movement   Right Shoulder ABduction 100 Degrees  pain during movement   Right Shoulder Internal Rotation 25 Degrees  pain during movement   Right Shoulder  External Rotation 30 Degrees  pain during movement   Left Shoulder Extension 38 Degrees  pain during movement   Left Shoulder Flexion 88 Degrees  pain during movement   Left Shoulder ABduction 92 Degrees  pain during movement   Left Shoulder Internal Rotation 25 Degrees  pain during movement   Left Shoulder External Rotation 30 Degrees  pain during movement   PROM   Overall PROM  Within functional limits for tasks performed   PROM Assessment Site Shoulder   Right/Left Shoulder Right;Left   Strength   Strength Assessment Site Shoulder   Right/Left Shoulder Right;Left   Right Shoulder Flexion 3+/5   Right Shoulder Extension 4/5   Right Shoulder ABduction 3+/5   Right Shoulder Internal Rotation 4-/5   Right Shoulder External Rotation 3+/5   Left Shoulder Flexion 3+/5   Left Shoulder Extension 4/5   Left Shoulder ABduction 3+/5   Left Shoulder Internal Rotation 4-/5   Left Shoulder External Rotation 3+/5   Palpation   Palpation comment tenderness at the bil upper traps and R supraspinatus muscle belly with referral of symptoms down the R arm to the wrist/hand   Special Tests    Special Tests Rotator Cuff Impingement   Rotator Cuff Impingment tests Michel Bickers test;Speed's test;Full Can test;Empty Can test;Painful Arc of Motion;other   Hawkins-Kennedy test   Findings Positive   Side Right   Empty Can test   Findings Positive   Side Right   Full Can test   Findings Positive   Side Right   Speed's test   Findings Positive   Side Right   Painful Arc of Motion   Findings Positive   Side Right   other   Findings Positive   Side Right   Comments scapular assist test                           PT Education - 07/15/15 1012    Education provided Yes   Education Details evaluation findings, POC, goals, HEP, anatomical education   Person(s) Educated Patient   Methods Explanation   Comprehension Verbalized understanding          PT Short Term  Goals - 07/15/15 1101    PT SHORT TERM GOAL #1   Title pt will be I with inital HEP (08/05/2015)   Time 3   Period Weeks   Status New   PT SHORT TERM GOAL #2   Title pt will be able to verbalize and demonstrate techniques to decrease R shoulder reinjury via postural awareness,  lifting and carrying mechanics, and HEP (08/05/2015)   Time 3   Period Weeks   Status New   PT SHORT TERM GOAL #3   Title pt will increase her FOTO score by > 10 points to assist with functional progression (08/05/2015)   Time 3   Period Weeks   Status New           PT Long Term Goals - 08/06/15 1102    PT LONG TERM GOAL #1   Title At discharge pt will be I with all HEP given throughout HEP (08/26/2015)   Time 6   Period Weeks   Status New   PT LONG TERM GOAL #2   Title pt will increase R shoulder AROM to [facility] to assist with ADLS (4/70/9628)   Time 6   Period Weeks   Status New   PT LONG TERM GOAL #3   Title pt will increase her R shoulder strength to > 4/5 to assist with lifting and carrying activities (08/26/2015)   Time 6   Period Weeks   Status New   PT LONG TERM GOAL #4   Title pt will be able to lift/ carry > 8# overhead with <2/10 pain during and following activity to assist with functional lifting associated with ADLs (08/26/2015)   Time 6   Period Weeks   Status New   PT LONG TERM GOAL #5   Title pt will increase her FOTO score to > 60 to demonstrate improved functional capacity at discharge (08/26/2015)   Time [place] - 2015-08-06 1014    Clinical Impression Statement [place] presents to OPPT with CC of right shoulder pain that started insidioulsy 2 weeks ago. She demonstrates poor posture with R sided scoliosis and scapular winging/ dyskinesia compared bil. She demonstrates limited AROM of bil shoulders secondary to pain, and MMT demonstrated significant weakness bil. Special testing was postive for hawkins-kennedy, scapular assist, open/empty can  and neers demonstrating good probabiliy of rotator cuff impingement. She would benefit from skilled physical therapy to maxmize her function and decreasing her pain by addressing the impairments listed.    Pt will benefit from skilled therapeutic intervention in order to improve on the following deficits Pain;Improper body mechanics;Postural dysfunction;Decreased strength;Decreased mobility;Impaired flexibility;Hypomobility;Decreased activity tolerance;Decreased endurance;Increased muscle spasms;Decreased range of motion;Impaired UE functional use   Rehab Potential Good   PT Frequency 2x / week   PT Duration 6 weeks   PT Treatment/Interventions ADLs/Self Care Home Management;Cryotherapy;Electrical Stimulation;Iontophoresis 2m/ml Dexamethasone;Moist Heat;Ultrasound;Therapeutic activities;Therapeutic exercise;Neuromuscular re-education;Patient/family education;Manual techniques;Taping;Dry needling;Passive range of motion   PT Next Visit Plan assess response to HEP, scapular stability, shoulder strengtheing, modalities PRN   PT Home Exercise Plan ceiling punches, shoulder IR/ER, rows   Consulted and Agree with Plan of Care Patient          G-Codes - 0September 09, [id number]    Functional Assessment Tool Used FOTO 65% limited   Functional Limitation Carrying, moving and handling objects   Carrying, Moving and Handling Objects Current Status (([id number] At least 60 percent but less than 80 percent impaired, limited or restricted   Carrying, Moving and Handling Objects Goal Status (([id number] At least 20 percent but less than 40 percent impaired, limited or restricted       Problem List Patient Active Problem List   Diagnosis Date Noted  . Diabetes 04/09/2015  . Well woman exam 04/09/2015  Starr Lake PT, DPT, LAT, ATC  07/15/2015  11:12 AM     [facility] [address] [place], [place], [address] Phone: [phone]   Fax:  [phone]

## 2015-07-15 NOTE — Patient Instructions (Signed)
   Starr Lake PT, DPT, LAT, ATC  [facility] Phone: [phone]

## 2015-07-21 ENCOUNTER — Ambulatory Visit: Payer: Medicare Other | Admitting: Physical Therapy

## 2015-07-21 DIAGNOSIS — M25612 Stiffness of left shoulder, not elsewhere classified: Secondary | ICD-10-CM

## 2015-07-21 DIAGNOSIS — M25611 Stiffness of right shoulder, not elsewhere classified: Secondary | ICD-10-CM

## 2015-07-21 DIAGNOSIS — R293 Abnormal posture: Secondary | ICD-10-CM

## 2015-07-21 DIAGNOSIS — M25511 Pain in right shoulder: Secondary | ICD-10-CM

## 2015-07-21 DIAGNOSIS — R29898 Other symptoms and signs involving the musculoskeletal system: Secondary | ICD-10-CM

## 2015-07-21 NOTE — Patient Instructions (Signed)
Shoulder Retraction / External Rotation With Band   With band held in front, keep elbows at side while rotating hands apart and pulling shoulder blades back. Hold ____1-2 seconds. Repeat 20____ times. Do 2-3____ sessions per day. Start with yellow band.  When this is easy try with red band.  Copyright  VHI. All rights reserved.

## 2015-07-21 NOTE — Therapy (Addendum)
[place], [place], [address] Phone: [phone]   Fax:  [phone]  Physical Therapy Treatment  Patient Details  Name: Robin Richards MRN: [id number] Date of Birth: 1976/03/23 Referring Provider:  Elwyn Reach, MD  Encounter Date: 07/21/2015      PT End of Session - 07/21/15 0900    Visit Number 2   Number of Visits 12   Date for PT Re-Evaluation 08/26/15   PT Start Time 0806   PT Stop Time 0906   PT Time Calculation (min) 60 min   Activity Tolerance Patient tolerated treatment well;Patient limited by pain   Behavior During Therapy [facility] for tasks assessed/performed      Past Medical History  Diagnosis Date  . Heartburn   . Scoliosis   . Arthritis     Past Surgical History  Procedure Laterality Date  . Spine surgery      There were no vitals filed for this visit.  Visit Diagnosis:  Pain in joint, shoulder region, right  Decreased ROM of right shoulder  Abnormal posture  Decreased ROM of left shoulder  Weakness of both arms      Subjective Assessment - 07/21/15 0807    Subjective 2days ago woke up with Lateral neck pain 7/10 with turning.  No pain with rest shoulder.  Pain 6/10 with moving   Currently in Pain? Yes   Pain Score 6    Pain Location Shoulder   Pain Orientation Right   Pain Descriptors / Indicators Aching   Pain Radiating Towards Rt hand numb and lateral Neck.     Pain Frequency Intermittent   Aggravating Factors  lifting reaching carrying .   Pain Relieving Factors naproxen, ice   Effect of Pain on Daily Activities wakes her up at night ,  limits turning neck, reaching   Multiple Pain Sites No                         [facility] Adult PT Treatment/Exercise - 07/21/15 0825    Shoulder Exercises: Supine   Other Supine Exercises decompression stretch 5 minutes with 1 pillow and neck roll.  shoulder press 5 5rep 5 seconds, Head press 5 reps 5 second  holds .  Upper traps relaxed with these, no shoulder pain   Other Supine Exercises serratus punches.  multiple cues and and aSSISTANCe needed   Shoulder Exercises: Standing   External Rotation 10 reps   Theraband Level (Shoulder External Rotation) Level 1 (Yellow)   External Rotation Limitations multiple cues   double arms worked better than single arm   Internal Rotation 10 reps   Theraband Level (Shoulder Internal Rotation) Level 2 (Red)   Internal Rotation Limitations multiple cues/ towel roll   Row 20 reps   Theraband Level (Shoulder Row) Level 2 (Red)   Row Limitations cues   Retraction AROM;Both;10 reps   Other Standing Exercises UE ranger tried.  too painful.for flexion   Moist Heat Therapy   Number Minutes Moist Heat 16 Minutes   Moist Heat Location Cervical;Shoulder  RT                  PT Short Term Goals - 07/21/15 0902    PT SHORT TERM GOAL #1   Title pt will be I with inital HEP (08/05/2015)   Baseline needs cues   Time 3   Period Weeks   Status On-going   PT SHORT TERM GOAL #2  Title pt will be able to verbalize and demonstrate techniques to decrease R shoulder reinjury via postural awareness, lifting and carrying mechanics, and HEP (08/05/2015)   Time 3   Period Weeks   Status On-going   PT SHORT TERM GOAL #3   Title pt will increase her FOTO score by > 10 points to assist with functional progression (08/05/2015)   Time 3   Period Weeks   Status Unable to assess           PT Long Term Goals - 07/15/15 1102    PT LONG TERM GOAL #1   Title At discharge pt will be I with all HEP given throughout HEP (08/26/2015)   Time 6   Period Weeks   Status New   PT LONG TERM GOAL #2   Title pt will increase R shoulder AROM to [facility] to assist with ADLS (2/90/2111)   Time 6   Period Weeks   Status New   PT LONG TERM GOAL #3   Title pt will increase her R shoulder strength to > 4/5 to assist with lifting and carrying activities (08/26/2015)   Time 6   Period  Weeks   Status New   PT LONG TERM GOAL #4   Title pt will be able to lift/ carry > 8# overhead with <2/10 pain during and following activity to assist with functional lifting associated with ADLs (08/26/2015)   Time 6   Period Weeks   Status New   PT LONG TERM GOAL #5   Title pt will increase her FOTO score to > 60 to demonstrate improved functional capacity at discharge (08/26/2015)   Time 6   Period Weeks   Status New               Plan - 07/21/15 0901    Clinical Impression Statement Multiple cues needed for home exercise.   PT Next Visit Plan continue to work on home exercise technique.   PT Home Exercise Plan both ER, yellow band   Consulted and Agree with Plan of Care Patient        Problem List Patient Active Problem List   Diagnosis Date Noted  . Diabetes 04/09/2015  . Well woman exam 04/09/2015    [facility] 07/21/2015, 9:04 AM  [facility] [address] [place], [place], [address] Phone: [phone]   Fax:  [phone]     Melvenia Needles, PTA 07/21/2015 9:04 AM Phone: [phone] Fax: [phone]    PHYSICAL THERAPY DISCHARGE SUMMARY  Visits from Start of Care: 2   Current functional level related to goals / functional outcomes: FOTO  65% limited   Remaining deficits: See goals   Education / Equipment: HEP  Plan:                                                    Patient goals were not met. Patient is being discharged due to not returning since the last visit.  ?????         Kristoffer Leamon PT, DPT, LAT, ATC  08/16/2015  1:56 PM

## 2015-07-26 ENCOUNTER — Ambulatory Visit: Payer: Medicare Other | Admitting: Physical Therapy

## 2015-07-28 ENCOUNTER — Ambulatory Visit: Payer: Medicare Other | Admitting: Physical Therapy

## 2015-08-04 ENCOUNTER — Ambulatory Visit: Payer: Medicare Other | Attending: Internal Medicine | Admitting: Physical Therapy

## 2015-08-05 ENCOUNTER — Ambulatory Visit: Payer: Medicare Other | Admitting: Physical Therapy

## 2015-08-09 ENCOUNTER — Ambulatory Visit: Payer: Medicare Other | Admitting: Physical Therapy

## 2015-08-11 ENCOUNTER — Ambulatory Visit: Payer: Medicare Other | Admitting: Physical Therapy

## 2015-08-16 ENCOUNTER — Ambulatory Visit: Payer: Medicare Other | Admitting: Physical Therapy

## 2015-08-18 ENCOUNTER — Ambulatory Visit: Payer: Medicare Other | Admitting: Physical Therapy

## 2016-02-05 DIAGNOSIS — E119 Type 2 diabetes mellitus without complications: Secondary | ICD-10-CM | POA: Diagnosis not present

## 2016-02-09 ENCOUNTER — Encounter: Payer: Self-pay | Admitting: Family Medicine

## 2016-02-09 ENCOUNTER — Ambulatory Visit (INDEPENDENT_AMBULATORY_CARE_PROVIDER_SITE_OTHER): Payer: Medicare Other | Admitting: Family Medicine

## 2016-02-09 VITALS — BP 124/64 | HR 64 | Wt 142.2 lb

## 2016-02-09 DIAGNOSIS — M419 Scoliosis, unspecified: Secondary | ICD-10-CM | POA: Diagnosis not present

## 2016-02-09 DIAGNOSIS — M545 Low back pain, unspecified: Secondary | ICD-10-CM

## 2016-02-09 DIAGNOSIS — G8929 Other chronic pain: Secondary | ICD-10-CM | POA: Diagnosis not present

## 2016-02-09 DIAGNOSIS — H9193 Unspecified hearing loss, bilateral: Secondary | ICD-10-CM

## 2016-02-09 DIAGNOSIS — Z8639 Personal history of other endocrine, nutritional and metabolic disease: Secondary | ICD-10-CM

## 2016-02-09 DIAGNOSIS — J45909 Unspecified asthma, uncomplicated: Secondary | ICD-10-CM | POA: Diagnosis not present

## 2016-02-09 DIAGNOSIS — M25511 Pain in right shoulder: Secondary | ICD-10-CM | POA: Diagnosis not present

## 2016-02-09 DIAGNOSIS — M1990 Unspecified osteoarthritis, unspecified site: Secondary | ICD-10-CM

## 2016-02-09 DIAGNOSIS — J4540 Moderate persistent asthma, uncomplicated: Secondary | ICD-10-CM | POA: Diagnosis not present

## 2016-02-09 DIAGNOSIS — Z720 Tobacco use: Secondary | ICD-10-CM

## 2016-02-09 DIAGNOSIS — F17200 Nicotine dependence, unspecified, uncomplicated: Secondary | ICD-10-CM

## 2016-02-09 MED ORDER — NAPROXEN SODIUM 220 MG PO CAPS: 2.0000 | ORAL_CAPSULE | Freq: Two times a day (BID) | ORAL | Status: DC

## 2016-02-09 NOTE — Patient Instructions (Signed)
Take 2 Aleve with food in morning and evening. Go back to physical therapist and use your visits, I think you will benefit from this.  I am sending you to a pulmonologist, lung specialist.  Start the [facility] inhaler today. Use 1 puff every day about the same time. Do not use more than once daily. Use your albuterol inhaler as the rescue inhaler. The goal is that you will need the albuterol inhaler less often by taking the once daily maintenance inhaler BREO.  [facility]- [place] [phone]

## 2016-02-09 NOTE — Progress Notes (Signed)
Subjective:    Patient ID: Robin Richards, female    DOB: June 24, 1976, 40 y.o.   MRN: [id number]  HPI Chief Complaint  Patient presents with  . new pt    new pt. shoulder pain- hard to lift it, pain on left side. asthma-,needs refill on inhaler - possible diabetes, last check was 3 months ago, will get records sent over here   She is new to the practice and here to establish primary care. She is switching to [place] from Dr. Jonelle Sidle. No records sent or brought today.  Last physical exam: 3 years ago per patient. Last pap smear: last year and normal per patient.  Mammogram: 05/2015- normal.   She is taking tramadol 3 times daily for "whole body hurting".  She was seeing a physical therapist for right shoulder pain. She states she has limited ROM to pain. She went for 2 visits and stopped going. She states it didn't seem to be helping. She states in the past she has taken stronger pain medication that helped. Denies pain at rest. Denies injury to her right shoulder.    History of asthma, diagnosed about 3 years ago. She is a smoker- 1 pack every 2-3 days.  Has a rescue inhaler and states she uses it 2-3 times daily. She reports she has intermittent daily wheezing and notices improvement with albuterol. Also has a nebulizer at home and does not use this. Denies having been to ED for exacerbations.  Denies fever, chills, chest pain, palpitations, shortness of breath, lower extremity edema, GI or GU symptoms.  She complains of hearing problem and had her hearing checked 3 years ago and told she needed hearing aids.  She has scoliosis and had surgery- has a rod in her back. Surgery in [place]. Complains of low back pain. States she thinks her pain is related to the scoliosis.    She does not work. States she sits at home and watches tv.  She has a sister in the area.   Smokes daily, denies drug use and alcohol. She does have a history of alcohol and marijuana use. Denies IV drug use. States she  dropped out of high school.  Reviewed allergies, medications, past medical, surgical, social history.   Review of Systems Pertinent positives and negatives in the history of present illness.     Objective:   Physical Exam BP 124/64 mmHg  Pulse 64  Wt 142 lb 3.2 oz (64.501 kg) Alert and in no distress.  Pharyngeal area is normal. Neck is supple without adenopathy or thyromegaly. Cardiac exam shows a regular sinus rhythm without murmurs or gallops. Lungs are clear to auscultation.  PFTs- moderately severe obstruction with a high COPD risk. FEV1/FVC actual 72%     Assessment & Plan:  Moderate persistent asthma, uncomplicated - Plan: CBC with Differential/Platelet, Ambulatory referral to Pulmonology  Asthma, mild persistent, uncomplicated - Plan: Spirometry with graph  Scoliosis deformity of spine  Smoker - Plan: Ambulatory referral to Pulmonology  Chronic right shoulder pain  History of elevated glucose - Plan: CBC with Differential/Platelet, Comprehensive metabolic panel  Chronic low back pain - Plan: Comprehensive metabolic panel  Arthritis  Hearing deficit, bilateral  Discussed that her pain appears to be chronic and unchanged. She has not been taking any anti-inflammatories in the recent past. Recommend Aleve 2 pills twice daily with food, samples given. She may also continue taking tramadol since she has this at home. Recommend that she continue going to physical therapy for right shoulder  pain and that she has 10 visits remaining per her chart. She agreed to do this. Discussed that I would not be prescribing narcotics for chronic pain. Discussed that she is having to use her rescue inhaler far too often and that her asthma is not well controlled. Breo sample given with directions for use. Discussed that smoking is making her asthma worse and recommend that she try to cut back and stop smoking nor to improve lung function. Explained in detail that by adding Breo inhaler as  her maintenance inhaler, that the goal is for her to not need her rescue inhaler as often. Referral made to pulmonologist for further pulmonary function testing. Basic labs ordered today and will add A1c if her glucose is elevated, she is fasting today. Will need to get her medical records from her previous provider.  Recommend she return to [facility] for hearing evaluation since it has been 2 years and she was told to return but did not follow-up.  Spent a minimum of 45 minutes with patient and at least 50% was in counseling and coordination of care.

## 2016-02-10 LAB — COMPREHENSIVE METABOLIC PANEL
ALK PHOS: 88 U/L (ref 33–115)
ALT: 12 U/L (ref 6–29)
AST: 13 U/L (ref 10–30)
Albumin: 4.1 g/dL (ref 3.6–5.1)
BILIRUBIN TOTAL: 0.5 mg/dL (ref 0.2–1.2)
BUN: 11 mg/dL (ref 7–25)
CO2: 26 mmol/L (ref 20–31)
Calcium: 8.9 mg/dL (ref 8.6–10.2)
Chloride: 109 mmol/L (ref 98–110)
Creat: 0.56 mg/dL (ref 0.50–1.10)
GLUCOSE: 99 mg/dL (ref 65–99)
Potassium: 4.4 mmol/L (ref 3.5–5.3)
Sodium: 142 mmol/L (ref 135–146)
TOTAL PROTEIN: 6.3 g/dL (ref 6.1–8.1)

## 2016-02-10 LAB — CBC WITH DIFFERENTIAL/PLATELET
Basophils Absolute: 0 10*3/uL (ref 0.0–0.1)
Basophils Relative: 0 % (ref 0–1)
EOS PCT: 4 % (ref 0–5)
Eosinophils Absolute: 0.3 10*3/uL (ref 0.0–0.7)
HCT: 41 % (ref 36.0–46.0)
Hemoglobin: 12.7 g/dL (ref 12.0–15.0)
Lymphocytes Relative: 32 % (ref 12–46)
Lymphs Abs: 2.7 10*3/uL (ref 0.7–4.0)
MCH: 22.4 pg — ABNORMAL LOW (ref 26.0–34.0)
MCHC: 31 g/dL (ref 30.0–36.0)
MCV: 72.3 fL — ABNORMAL LOW (ref 78.0–100.0)
MPV: 10.4 fL (ref 8.6–12.4)
Monocytes Absolute: 0.6 10*3/uL (ref 0.1–1.0)
Monocytes Relative: 7 % (ref 3–12)
NEUTROS ABS: 4.8 10*3/uL (ref 1.7–7.7)
Neutrophils Relative %: 57 % (ref 43–77)
Platelets: 276 10*3/uL (ref 150–400)
RBC: 5.67 MIL/uL — AB (ref 3.87–5.11)
RDW: 15 % (ref 11.5–15.5)
WBC: 8.5 10*3/uL (ref 4.0–10.5)

## 2016-02-23 ENCOUNTER — Encounter: Payer: Self-pay | Admitting: Family Medicine

## 2016-02-23 ENCOUNTER — Ambulatory Visit (INDEPENDENT_AMBULATORY_CARE_PROVIDER_SITE_OTHER): Payer: Medicare Other | Admitting: Family Medicine

## 2016-02-23 VITALS — BP 118/80 | HR 93 | Resp 24 | Wt 132.0 lb

## 2016-02-23 DIAGNOSIS — Z720 Tobacco use: Secondary | ICD-10-CM | POA: Diagnosis not present

## 2016-02-23 DIAGNOSIS — F17200 Nicotine dependence, unspecified, uncomplicated: Secondary | ICD-10-CM

## 2016-02-23 DIAGNOSIS — R7309 Other abnormal glucose: Secondary | ICD-10-CM | POA: Diagnosis not present

## 2016-02-23 DIAGNOSIS — J302 Other seasonal allergic rhinitis: Secondary | ICD-10-CM

## 2016-02-23 DIAGNOSIS — J4540 Moderate persistent asthma, uncomplicated: Secondary | ICD-10-CM | POA: Diagnosis not present

## 2016-02-23 MED ORDER — FLUTICASONE FUROATE-VILANTEROL 200-25 MCG/INH IN AEPB: 1.0000 | INHALATION_SPRAY | Freq: Every day | RESPIRATORY_TRACT | Status: DC

## 2016-02-23 MED ORDER — FLUTICASONE PROPIONATE 50 MCG/ACT NA SUSP: 2.0000 | Freq: Every day | NASAL | Status: DC

## 2016-02-23 NOTE — Progress Notes (Signed)
   Subjective:    Patient ID: Robin Richards, female    DOB: [birth year], 40 y.o.   MRN: [id number]  HPI Chief Complaint  Patient presents with  . Follow-up    asthma. states she is using inhaler. says she can breathe alot better. has an appt with her lung doctor 03/01/16   She is here for follow-up on asthma. At her last visit she was using her albuterol inhaler 2-3 times daily and reported daily wheezing. Today she states she has not needed her albuterol inhaler and has been taking daily Breo. She is still smoking but has reduced the number of cigarettes per day. She is now smoking 2 cigarettes in the morning and 1 in the evening. States she has an appointment next week with a pulmonologist.  Denies fever, chills, cough, shortness of breath or wheezing.  She reports her allergies are bothering her, she has noticed nasal drainage and sneezing and would like for me to prescribe Flonase inhaler. States she has used this in the past for allergies.  Medical records received show that she has had an elevated A1c in the past. Her glucose at last visit was 99. She states she is watching her sugar intake and has started walking more.  Denies polyuria polydipsia   Review of Systems Pertinent positives and negatives in the history of present illness.     Objective:   Physical Exam BP 118/80 mmHg  Pulse 93  Resp 24  Wt 132 lb (59.875 kg)  SpO2 99%  Alert and in no distress. No sinus tenderness. Nares with erythema and clear drainage. Pharyngeal area is normal. Neck is supple without adenopathy or thyromegaly. Cardiac exam shows a regular sinus rhythm without murmurs or gallops. Lungs are clear to auscultation.   Hemoglobin A1c: 5.7     Assessment & Plan:  Asthma, moderate persistent, uncomplicated  Seasonal allergies  Elevated hemoglobin A1c - Plan: POCT glycosylated hemoglobin (Hb A1C), Microalbumin / creatinine urine ratio  Smoker  Congratulated her on cutting back on smoking and  encouraged her to continue to cut back. Discussed that her A1c today is 5.7 and that this is considered prediabetes. Recommend that she continue watching her sugar and carbohydrate intake and get at least 150 minutes of physical activity per week. Flonase prescription sent to pharmacy. Her asthma appears better controlled on once daily Breo. Recommend that she continue on this medication and follow-up as scheduled with pulmonologist.  She will follow-up in 3 months or sooner if needed.

## 2016-02-23 NOTE — Patient Instructions (Signed)
You have an appointment at [facility] pulmonology on April 5th- the address is [place] is their phone number.   Continue taking Breo inhaler daily.  Your hemoglobin A1c today is 5.7- you have pre-diabetes and I recommend continuing to watch your sugar and carbohydrate intake and keep walking for exercise. We will recheck this in the future.

## 2016-03-01 ENCOUNTER — Ambulatory Visit (INDEPENDENT_AMBULATORY_CARE_PROVIDER_SITE_OTHER): Payer: Medicare Other | Admitting: Pulmonary Disease

## 2016-03-01 ENCOUNTER — Other Ambulatory Visit (INDEPENDENT_AMBULATORY_CARE_PROVIDER_SITE_OTHER): Payer: Medicare Other

## 2016-03-01 ENCOUNTER — Encounter: Payer: Self-pay | Admitting: Pulmonary Disease

## 2016-03-01 VITALS — BP 114/86 | HR 82 | Ht 62.5 in | Wt 131.0 lb

## 2016-03-01 DIAGNOSIS — F17210 Nicotine dependence, cigarettes, uncomplicated: Secondary | ICD-10-CM

## 2016-03-01 DIAGNOSIS — J4540 Moderate persistent asthma, uncomplicated: Secondary | ICD-10-CM

## 2016-03-01 DIAGNOSIS — Z720 Tobacco use: Secondary | ICD-10-CM | POA: Diagnosis not present

## 2016-03-01 LAB — CBC WITH DIFFERENTIAL/PLATELET
BASOS ABS: 0.3 10*3/uL — AB (ref 0.0–0.1)
Basophils Relative: 2.9 % (ref 0.0–3.0)
EOS ABS: 0.5 10*3/uL (ref 0.0–0.7)
Eosinophils Relative: 5.4 % — ABNORMAL HIGH (ref 0.0–5.0)
HCT: 41.1 % (ref 36.0–46.0)
Hemoglobin: 13.2 g/dL (ref 12.0–15.0)
Lymphocytes Relative: 26 % (ref 12.0–46.0)
Lymphs Abs: 2.4 10*3/uL (ref 0.7–4.0)
MCHC: 32.1 g/dL (ref 30.0–36.0)
MCV: 70.1 fl — ABNORMAL LOW (ref 78.0–100.0)
Monocytes Absolute: 0.6 10*3/uL (ref 0.1–1.0)
Monocytes Relative: 6.7 % (ref 3.0–12.0)
NEUTROS PCT: 59 % (ref 43.0–77.0)
Neutro Abs: 5.5 10*3/uL (ref 1.4–7.7)
Platelets: 269 10*3/uL (ref 150.0–400.0)
RBC: 5.86 Mil/uL — AB (ref 3.87–5.11)
RDW: 14.8 % (ref 11.5–15.5)
WBC: 9.3 10*3/uL (ref 4.0–10.5)

## 2016-03-01 LAB — NITRIC OXIDE: NITRIC OXIDE: 7

## 2016-03-01 NOTE — Progress Notes (Signed)
   Subjective:    Patient ID: Robin Richards, female    DOB: 1976/10/09, 40 y.o.   MRN: [id number]  HPI Consult for evaluation of Asthma, dyspnea.  Robin Richards is a 40 year old with past medical history of moderate persistent asthma. She was diagnosed a few years ago. Her chief complaints are dyspnea on exertion at at rest associated with wheezing. She has cough nonproductive in nature. She has seasonal allergies but denies any rhinitis, postnasal drip, GERD. She was started on by mouth last month by her primary care physician. She's noted significant improvement in symptoms since then.  She has snoring at night, no witnessed apneas. She has daytime fatigue and tiredness.  DATA: CXR 04/09/14 Significant scoliosis with rod fixation. No acute cardiopulmonary abnormality.  PFTs 3/50/17 FVC 1.91 (66%) FEV1 1.37 (57%) F/F 72 Likely has mixed obstructive, restrictive physiology. Mod to severe reduction in FEV1  Social History: Half pack per day smoker for 20 years. She continues to smoke. No alcohol, drug use. She lives with her significant other. 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  Family history: Not significant.  Review of Systems Cough, dyspnea, wheezing. No sputum production, hemoptysis. No fevers, chills, loss of weight, loss of appetite. No nausea, vomiting, diarrhea, constipation. No chest pain, palpitation. All other review of systems are negative    Objective:   Physical Exam Blood pressure 114/86, pulse 82, height 5' 2.5" (1.588 m), weight 131 lb (59.421 kg), SpO2 97 %. Gen: No apparent distress Neuro: No gross focal deficits. HEENT: No JVD, lymphadenopathy, thyromegaly. RS: Clear, No wheeze or crackles CVS: S1-S2 heard, no murmurs rubs gallops. Abdomen: Soft, positive bowel sounds. Musculoskeletal: No edema.    Assessment & Plan:  Consult for moderate persistent asthma.  She has been started on breo and reports significant improvement in symptoms.  She continues to use Flonase for her seasonal allergies. She likely has COPD from smoking and also a restrictive physiology from her scoliosis. I will get a FeNo and check CBC with diff and IgE levels and full PFTs.   Smoking cessation encouraged and she want to try and quit on her own (5 mins spent on counseling). She will need an eval for OSA. I will readdress this at next visit.   Plan: - Check FeNo, CBC with diff, IgE - Full PFTs - Continue Breo - Smoking cessation.  Return in 3 months.  Marshell Garfinkel MD [facility] Pager [phone] If no answer or after 3pm call: [phone] 03/01/2016, 11:41 AM

## 2016-03-01 NOTE — Patient Instructions (Addendum)
Will continue Breo and renew the prescription. We will check CBC with differential and IgE levels., full PFTs Check FeNo  Return to clinic in 3 months.

## 2016-03-01 NOTE — Progress Notes (Signed)
   Subjective:    Patient ID: Robin Richards, female    DOB: [birth year], 40 y.o.   MRN: [id number]  HPI    Review of Systems  Constitutional: Negative for fever and unexpected weight change.  HENT: Negative for congestion, dental problem, ear pain, nosebleeds, postnasal drip, rhinorrhea, sinus pressure, sneezing, sore throat and trouble swallowing.   Eyes: Negative for redness and itching.  Respiratory: Positive for cough and shortness of breath. Negative for chest tightness and wheezing.   Cardiovascular: Negative for palpitations and leg swelling.  Gastrointestinal: Negative for nausea and vomiting.  Genitourinary: Negative for dysuria.  Musculoskeletal: Negative for joint swelling.  Skin: Negative for rash.  Neurological: Negative for headaches.  Hematological: Does not bruise/bleed easily.  Psychiatric/Behavioral: Negative for dysphoric mood. The patient is not nervous/anxious.        Objective:   Physical Exam        Assessment & Plan:

## 2016-03-02 LAB — IGE: IgE (Immunoglobulin E), Serum: 236 kU/L — ABNORMAL HIGH (ref <115)

## 2016-03-14 ENCOUNTER — Telehealth: Payer: Self-pay | Admitting: Family Medicine

## 2016-03-14 NOTE — Telephone Encounter (Signed)
Needs refill on tramadol   [employer]

## 2016-03-15 NOTE — Telephone Encounter (Signed)
Pt is coming in on Monday 03/21/16

## 2016-03-15 NOTE — Telephone Encounter (Signed)
She will need to be seen.   I have not prescribed Tramadol for her in past. She needs to bring in the prescription bottle and I need more information on why she is taking this.

## 2016-03-20 ENCOUNTER — Institutional Professional Consult (permissible substitution): Payer: Medicare Other | Admitting: Family Medicine

## 2016-03-22 ENCOUNTER — Encounter: Payer: Self-pay | Admitting: Family Medicine

## 2016-04-19 DIAGNOSIS — E119 Type 2 diabetes mellitus without complications: Secondary | ICD-10-CM | POA: Diagnosis not present

## 2016-04-19 DIAGNOSIS — Z0100 Encounter for examination of eyes and vision without abnormal findings: Secondary | ICD-10-CM | POA: Diagnosis not present

## 2016-04-19 LAB — HM DIABETES EYE EXAM

## 2016-05-26 ENCOUNTER — Encounter (HOSPITAL_COMMUNITY): Payer: Self-pay

## 2016-05-26 ENCOUNTER — Emergency Department (HOSPITAL_COMMUNITY)
Admission: EM | Admit: 2016-05-26 | Discharge: 2016-05-26 | Disposition: A | Discharge status: Home / Self Care | Payer: Medicare Other | Attending: Emergency Medicine | Admitting: Emergency Medicine

## 2016-05-26 ENCOUNTER — Emergency Department (HOSPITAL_COMMUNITY): Payer: Medicare Other

## 2016-05-26 DIAGNOSIS — Z7951 Long term (current) use of inhaled steroids: Secondary | ICD-10-CM | POA: Diagnosis not present

## 2016-05-26 DIAGNOSIS — J4520 Mild intermittent asthma, uncomplicated: Secondary | ICD-10-CM | POA: Diagnosis not present

## 2016-05-26 DIAGNOSIS — F17210 Nicotine dependence, cigarettes, uncomplicated: Secondary | ICD-10-CM | POA: Insufficient documentation

## 2016-05-26 DIAGNOSIS — Z791 Long term (current) use of non-steroidal anti-inflammatories (NSAID): Secondary | ICD-10-CM | POA: Insufficient documentation

## 2016-05-26 DIAGNOSIS — R0602 Shortness of breath: Secondary | ICD-10-CM | POA: Diagnosis not present

## 2016-05-26 DIAGNOSIS — J069 Acute upper respiratory infection, unspecified: Secondary | ICD-10-CM | POA: Diagnosis not present

## 2016-05-26 DIAGNOSIS — M1990 Unspecified osteoarthritis, unspecified site: Secondary | ICD-10-CM | POA: Diagnosis not present

## 2016-05-26 DIAGNOSIS — R069 Unspecified abnormalities of breathing: Secondary | ICD-10-CM | POA: Diagnosis not present

## 2016-05-26 DIAGNOSIS — J029 Acute pharyngitis, unspecified: Secondary | ICD-10-CM | POA: Diagnosis present

## 2016-05-26 MED ORDER — ALBUTEROL SULFATE (2.5 MG/3ML) 0.083% IN NEBU
5.0000 mg | INHALATION_SOLUTION | Freq: Once | RESPIRATORY_TRACT | Status: AC
Start: 2016-05-26 — End: 2016-05-26
  Administered 2016-05-26: 5 mg via RESPIRATORY_TRACT
  Filled 2016-05-26: qty 6

## 2016-05-26 MED ORDER — IBUPROFEN 800 MG PO TABS
800.0000 mg | ORAL_TABLET | Freq: Once | ORAL | Status: AC
  Administered 2016-05-26: 800 mg via ORAL
  Filled 2016-05-26: qty 1

## 2016-05-26 MED ORDER — BENZONATATE 100 MG PO CAPS: 100.0000 mg | ORAL_CAPSULE | Freq: Three times a day (TID) | ORAL | Status: DC

## 2016-05-26 MED ORDER — ALBUTEROL SULFATE HFA 108 (90 BASE) MCG/ACT IN AERS: 1.0000 | INHALATION_SPRAY | Freq: Four times a day (QID) | RESPIRATORY_TRACT | Status: DC | PRN

## 2016-05-26 MED ORDER — PREDNISONE 20 MG PO TABS: ORAL_TABLET | ORAL | Status: DC

## 2016-05-26 MED ORDER — PREDNISONE 20 MG PO TABS
60.0000 mg | ORAL_TABLET | Freq: Once | ORAL | Status: AC
  Administered 2016-05-26: 60 mg via ORAL
  Filled 2016-05-26: qty 3

## 2016-05-26 MED ORDER — BENZONATATE 100 MG PO CAPS
200.0000 mg | ORAL_CAPSULE | Freq: Once | ORAL | Status: AC
  Administered 2016-05-26: 200 mg via ORAL
  Filled 2016-05-26: qty 2

## 2016-05-26 NOTE — ED Notes (Signed)
Bed: [facility]2 Expected date:  Expected time:  Means of arrival:  Comments: EMS 40yo F / Asthma / Wheezing improved after Neb

## 2016-05-26 NOTE — ED Notes (Signed)
Patient presents from home via ems for SOB, no relief with inhaler at home. EMS reports audible wheezing. 37m albuterol in route.   Last VS: 100% ra, 130/90.

## 2016-05-26 NOTE — ED Provider Notes (Signed)
CSN: [id number]     Arrival date & time 05/26/16  0133 History   First MD Initiated Contact with Patient 05/26/16 [phone]     Chief Complaint  Patient presents with  . Cough  . Sore Throat  . Shortness of Breath     (Consider location/radiation/quality/duration/timing/severity/associated sxs/prior Treatment) Patient is a 40 y.o. female presenting with cough, pharyngitis, and shortness of breath. The history is provided by the patient.  Cough Cough characteristics:  Non-productive Severity:  Moderate Onset quality:  Gradual Timing:  Intermittent Progression:  Unchanged Chronicity:  Recurrent Context: upper respiratory infection   Context: not animal exposure   Relieved by:  Nothing Worsened by:  Nothing tried Ineffective treatments:  None tried Associated symptoms: shortness of breath and wheezing   Associated symptoms: no chest pain and no diaphoresis   Risk factors: no chemical exposure   Sore Throat Associated symptoms include shortness of breath. Pertinent negatives include no chest pain.  Shortness of Breath Associated symptoms: cough and wheezing   Associated symptoms: no chest pain and no diaphoresis     Past Medical History  Diagnosis Date  . Heartburn   . Scoliosis   . Arthritis   . Asthma   . Elevated hemoglobin A1c    Past Surgical History  Procedure Laterality Date  . Spine surgery     History reviewed. No pertinent family history. Social History  Substance Use Topics  . Smoking status: Current Every Day Smoker -- 0.50 packs/day for 20 years    Types: Cigarettes  . Smokeless tobacco: Never Used  . Alcohol Use: No   OB History    No data available     Review of Systems  Constitutional: Negative for diaphoresis.  Respiratory: Positive for cough, shortness of breath and wheezing.   Cardiovascular: Negative for chest pain, palpitations and leg swelling.  All other systems reviewed and are negative.     Allergies  Review of patient's allergies  indicates no known allergies.  Home Medications   Prior to Admission medications   Medication Sig Start Date End Date Taking? Authorizing Provider  albuterol (PROVENTIL HFA;VENTOLIN HFA) 108 (90 BASE) MCG/ACT inhaler Inhale 1-2 puffs into the lungs every 6 (six) hours as needed for wheezing or shortness of breath.   Yes Historical Provider, MD  ibuprofen (ADVIL,MOTRIN) 600 MG tablet Take 600 mg by mouth every 6 (six) hours as needed for fever, headache or mild pain. Reported on 02/23/2016 04/09/14  Yes Julianne Rice, MD  naproxen (NAPROSYN) 500 MG tablet Take 500 mg by mouth 2 (two) times daily with a meal.   Yes Historical Provider, MD  fluticasone (FLONASE) 50 MCG/ACT nasal spray Place 2 sprays into both nostrils daily. Patient not taking: Reported on 05/26/2016 02/23/16   Girtha Rm, NP  fluticasone furoate-vilanterol (BREO ELLIPTA) 200-25 MCG/INH AEPB Inhale 1 puff into the lungs daily. Patient not taking: Reported on 05/26/2016 02/23/16   Girtha Rm, NP   BP 104/69 mmHg  Pulse 80  Temp(Src) 98.1 F (36.7 C) (Oral)  Resp 20  SpO2 98%  LMP 05/19/2016 (Approximate) Physical Exam  Constitutional: She is oriented to person, place, and time. She appears well-developed and well-nourished. No distress.  HENT:  Head: Normocephalic and atraumatic.  Mouth/Throat: Oropharynx is clear and moist.  Eyes: Conjunctivae are normal. Pupils are equal, round, and reactive to light.  Neck: Normal range of motion. Neck supple.  Cardiovascular: Normal rate, regular rhythm and intact distal pulses.   Pulmonary/Chest: Effort normal and breath  sounds normal. No respiratory distress. She has no wheezes. She has no rales. She exhibits no tenderness.  Abdominal: Soft. Bowel sounds are normal. There is no tenderness. There is no rebound and no guarding.  Musculoskeletal: Normal range of motion. She exhibits no edema or tenderness.  Neurological: She is alert and oriented to person, place, and time. She  has normal reflexes.  Skin: Skin is warm and dry.  Psychiatric: She has a normal mood and affect.    ED Course  Procedures (including critical care time) Labs Review Labs Reviewed - No data to display  Imaging Review Dg Chest 2 View  05/26/2016  CLINICAL DATA:  Initial evaluation for acute shortness of breath. EXAM: CHEST  2 VIEW COMPARISON:  Prior radiograph from 04/09/2014. FINDINGS: Cardiac and mediastinal silhouettes are stable. Bowing of the tracheal air column is unchanged. Lungs normally inflated. No focal infiltrate, pulmonary edema, or pleural effusion. Mild scattered peribronchial thickening. No pneumothorax. Scoliosis with Harrington fixation rods in place. No acute osseous abnormality. IMPRESSION: 1. Subtle diffuse peribronchial thickening, which may reflect sequela of reactive airways disease or acute bronchiolitis. No consolidative airspace opacity or other abnormality identified. 2. Scoliosis with Harrington fixation rods in place. Electronically Signed   By: Jeannine Boga M.D.   On: 05/26/2016 03:19   I have personally reviewed and evaluated these images and lab results as part of my medical decision-making.   EKG Interpretation   Date/Time:  Friday May 26 2016 02:25:02 EDT Ventricular Rate:  85 PR Interval:    QRS Duration: 79 QT Interval:  366 QTC Calculation: 436 R Axis:   13 Text Interpretation:  Sinus rhythm Confirmed by [facility]  MD, Jentry Mcqueary  ([id number]) on 05/26/2016 5:10:48 AM      MDM   Final diagnoses:  None    Filed Vitals:   05/26/16 0541 05/26/16 0545  BP: 104/69 104/69  Pulse: 90 80  Temp:  98.1 F (36.7 C)  Resp:  20   Results for orders placed or performed in visit on 03/01/16  Nitric oxide  Result Value Ref Range   Nitric Oxide 7    Dg Chest 2 View  05/26/2016  CLINICAL DATA:  Initial evaluation for acute shortness of breath. EXAM: CHEST  2 VIEW COMPARISON:  Prior radiograph from 04/09/2014. FINDINGS: Cardiac and mediastinal  silhouettes are stable. Bowing of the tracheal air column is unchanged. Lungs normally inflated. No focal infiltrate, pulmonary edema, or pleural effusion. Mild scattered peribronchial thickening. No pneumothorax. Scoliosis with Harrington fixation rods in place. No acute osseous abnormality. IMPRESSION: 1. Subtle diffuse peribronchial thickening, which may reflect sequela of reactive airways disease or acute bronchiolitis. No consolidative airspace opacity or other abnormality identified. 2. Scoliosis with Harrington fixation rods in place. Electronically Signed   By: Jeannine Boga M.D.   On: 05/26/2016 03:19   Will send home with tessalon, prednisone, albuterol and NSAIDs.   Close follow up with your PMD, strict return precautions     Madie Cahn, MD 05/26/16 [phone]

## 2016-05-26 NOTE — ED Notes (Signed)
Patient c/o sore throat, cough and SOB x2 days.  Patient states that has been taking her albuterol inhaler x2 a day for the past 2 days without relief.  Patient  Denies chest pain.  Patient states that when coughs states that mucous is white and sometimes has little streaks of blood.  Patient states pain 7/10.

## 2016-05-29 ENCOUNTER — Ambulatory Visit (INDEPENDENT_AMBULATORY_CARE_PROVIDER_SITE_OTHER): Payer: Medicare Other | Admitting: Family Medicine

## 2016-05-29 ENCOUNTER — Encounter: Payer: Self-pay | Admitting: Family Medicine

## 2016-05-29 ENCOUNTER — Telehealth: Payer: Self-pay

## 2016-05-29 VITALS — BP 118/62 | HR 64 | Wt 138.4 lb

## 2016-05-29 DIAGNOSIS — Z720 Tobacco use: Secondary | ICD-10-CM | POA: Diagnosis not present

## 2016-05-29 DIAGNOSIS — J4540 Moderate persistent asthma, uncomplicated: Secondary | ICD-10-CM

## 2016-05-29 DIAGNOSIS — R7303 Prediabetes: Secondary | ICD-10-CM | POA: Diagnosis not present

## 2016-05-29 DIAGNOSIS — F17200 Nicotine dependence, unspecified, uncomplicated: Secondary | ICD-10-CM

## 2016-05-29 LAB — POCT GLYCOSYLATED HEMOGLOBIN (HGB A1C): Hemoglobin A1C: 6

## 2016-05-29 NOTE — Progress Notes (Signed)
  Subjective:    Patient ID: Robin Richards, female    DOB: [birth year], 40 y.o.   MRN: [id number]  Robin Richards is a 40 y.o. female who presents for follow-up of Type 2 diabetes mellitus. A1c today 6.0%, last A1c 5.7%  She has not smoked since 05/26/2016. She went to the emergency department for an asthma exacerbation on that date. She was given steroids and cough medication and symptoms have significantly improved. She is still taking the steroids but denies any fever, chills, shortness of breath, wheezing, cough and states she feels back to normal.  Patient is checking home blood sugars.   Home blood sugar records: BGs range between 117 and 120 she states these readings are fasting.  How often is blood sugars being checked: checking sugar once a day Current symptoms include: none. Patient denies nausea, visual disturbances, vomiting and weight loss.  Patient is checking their feet daily. Any Foot concerns (callous, ulcer, wound, thickened nails, toenail fungus, skin fungus, hammer toe): none Last dilated eye exam: June 16th, [facility]  Current treatments: diet and exercise and daily fasting blood sugar checks. Medication compliance: good  Current diet: diabetic Current exercise: walking Known diabetic complications: none  The following portions of the patient's history were reviewed and updated as appropriate: allergies, current medications, past medical history, past social history and problem list.  ROS as in subjective above.     Objective:    Physical Exam Alert and in no distress.  Pharyngeal area is normal. Neck is supple without adenopathy or thyromegaly. Cardiac exam shows a regular rhythm without murmurs or gallops. Lungs are clear to auscultation.   Blood pressure 118/62, pulse 64, weight 138 lb 6.4 oz (62.778 kg), last menstrual period 05/19/2016.  Lab Review Diabetic Labs Latest Ref Rng 05/29/2016 02/09/2016 07/01/2014 04/08/2014 12/04/2013  HbA1c - 5.7% - - - -    Creatinine 0.50 - 1.10 mg/dL - 0.56 0.59 0.71 0.65   BP/Weight 05/29/2016 05/26/2016 03/01/2016 02/23/2016 0/48/8891  Systolic BP 694 503 888 280 034  Diastolic BP 62 77 86 80 64  Wt. (Lbs) 138.4 - 131 132 142.2  BMI 24.89 - 23.56 23.39 25.2   No flowsheet data found.  Robin Richards  reports that she has been smoking Cigarettes.  She has a 10 pack-year smoking history. She has never used smokeless tobacco. She reports that she does not drink alcohol or use illicit drugs.     Assessment & Plan:    Prediabetes  Smoker  Asthma, moderate persistent, uncomplicated  1. Rx changes: none 2. Education: Reviewed 'ABCs' of diabetes management (respective goals in parentheses):  A1C (<7), blood pressure (<130/80), and cholesterol (LDL <100). 3. Compliance at present is estimated to be good. Efforts to improve compliance (if necessary) will be directed at dietary modifications: eat low carbs and sugar, increased exercise and regular blood sugar monitoring: daily. 4. Will attempt to get dilated eye exam report and scan into chart, she had this done on 05/12/2016 at [facility].  5. Follow up: 4 months  6. Smoking cessation counseling to reduce health risks such as heart disease and cancer.  7. No changes to asthma medication. Follow-up as scheduled with her pulmonologist in 1 week.  Spent a minimum of 25 minutes with patient and at least 50% was in counseling and coordination of care. Counseled on diet and exercise for diabetes, smoking cessation to better manage asthma.

## 2016-05-29 NOTE — Telephone Encounter (Signed)
Lm for pt to return call. Robin Richards

## 2016-05-29 NOTE — Patient Instructions (Addendum)
Complete the course of steroids that were prescribed to you in the emergency department. Do not start back smoking. Try to keep busy with other hobbies when you want to smoke.  Follow-up with the pulmonologist as scheduled on July 10. They have used scheduled for pulmonary function tests and then to see the doctor. Follow up with me in 4 months.   Smoking Cessation, Tips for Success If you are ready to quit smoking, congratulations! You have chosen to help yourself be healthier. Cigarettes bring nicotine, tar, carbon monoxide, and other irritants into your body. Your lungs, heart, and blood vessels will be able to work better without these poisons. There are many different ways to quit smoking. Nicotine gum, nicotine patches, a nicotine inhaler, or nicotine nasal spray can help with physical craving. Hypnosis, support groups, and medicines help break the habit of smoking. WHAT THINGS CAN I DO TO MAKE QUITTING EASIER?  Here are some tips to help you quit for good:  Pick a date when you will quit smoking completely. Tell all of your friends and family about your plan to quit on that date.  Do not try to slowly cut down on the number of cigarettes you are smoking. Pick a quit date and quit smoking completely starting on that day.  Throw away all cigarettes.   Clean and remove all ashtrays from your home, work, and car.  On a card, write down your reasons for quitting. Carry the card with you and read it when you get the urge to smoke.  Cleanse your body of nicotine. Drink enough water and fluids to keep your urine clear or pale yellow. Do this after quitting to flush the nicotine from your body.  Learn to predict your moods. Do not let a bad situation be your excuse to have a cigarette. Some situations in your life might tempt you into wanting a cigarette.  Never have "just one" cigarette. It leads to wanting another and another. Remind yourself of your decision to quit.  Change habits  associated with smoking. If you smoked while driving or when feeling stressed, try other activities to replace smoking. Stand up when drinking your coffee. Brush your teeth after eating. Sit in a different chair when you read the paper. Avoid alcohol while trying to quit, and try to drink fewer caffeinated beverages. Alcohol and caffeine may urge you to smoke.  Avoid foods and drinks that can trigger a desire to smoke, such as sugary or spicy foods and alcohol.  Ask people who smoke not to smoke around you.  Have something planned to do right after eating or having a cup of coffee. For example, plan to take a walk or exercise.  Try a relaxation exercise to calm you down and decrease your stress. Remember, you may be tense and nervous for the first 2 weeks after you quit, but this will pass.  Find new activities to keep your hands busy. Play with a pen, coin, or rubber band. Doodle or draw things on paper.  Brush your teeth right after eating. This will help cut down on the craving for the taste of tobacco after meals. You can also try mouthwash.   Use oral substitutes in place of cigarettes. Try using lemon drops, carrots, cinnamon sticks, or chewing gum. Keep them handy so they are available when you have the urge to smoke.  When you have the urge to smoke, try deep breathing.  Designate your home as a nonsmoking area.  If you are  a heavy smoker, ask your health care provider about a prescription for nicotine chewing gum. It can ease your withdrawal from nicotine.  Reward yourself. Set aside the cigarette money you save and buy yourself something nice.  Look for support from others. Join a support group or smoking cessation program. Ask someone at home or at work to help you with your plan to quit smoking.  Always ask yourself, "Do I need this cigarette or is this just a reflex?" Tell yourself, "Today, I choose not to smoke," or "I do not want to smoke." You are reminding yourself of your  decision to quit.  Do not replace cigarette smoking with electronic cigarettes (commonly called e-cigarettes). The safety of e-cigarettes is unknown, and some may contain harmful chemicals.  If you relapse, do not give up! Plan ahead and think about what you will do the next time you get the urge to smoke. HOW WILL I FEEL WHEN I QUIT SMOKING? You may have symptoms of withdrawal because your body is used to nicotine (the addictive substance in cigarettes). You may crave cigarettes, be irritable, feel very hungry, cough often, get headaches, or have difficulty concentrating. The withdrawal symptoms are only temporary. They are strongest when you first quit but will go away within 10-14 days. When withdrawal symptoms occur, stay in control. Think about your reasons for quitting. Remind yourself that these are signs that your body is healing and getting used to being without cigarettes. Remember that withdrawal symptoms are easier to treat than the major diseases that smoking can cause.  Even after the withdrawal is over, expect periodic urges to smoke. However, these cravings are generally short lived and will go away whether you smoke or not. Do not smoke! WHAT RESOURCES ARE AVAILABLE TO HELP ME QUIT SMOKING? Your health care provider can direct you to community resources or hospitals for support, which may include:  Group support.  Education.  Hypnosis.  Therapy.   This information is not intended to replace advice given to you by your health care provider. Make sure you discuss any questions you have with your health care provider.   Document Released: 08/11/2004 Document Revised: 12/04/2014 Document Reviewed: 05/01/2013 Elsevier Interactive Patient Education [employer].

## 2016-05-29 NOTE — Telephone Encounter (Signed)
She stated that she went to an eye office on [address]. Can you find out from patient or call to see where she may have gone?

## 2016-05-29 NOTE — Telephone Encounter (Signed)
I called [facility] and they have not seen pt since 2014. They will fax copy of report for our records. Just FYI. [name] December

## 2016-05-30 LAB — MICROALBUMIN / CREATININE URINE RATIO
Creatinine, Urine: 162 mg/dL (ref 20–320)
MICROALB/CREAT RATIO: 4 ug/mg{creat} (ref <30)
Microalb, Ur: 0.7 mg/dL

## 2016-06-01 NOTE — Telephone Encounter (Signed)
[name]  [phone] Eye exam was 05/20/2016. Will call to request records. [name] December

## 2016-06-01 NOTE — Telephone Encounter (Signed)
Spoke with pt- she states that she will call back with the office number.

## 2016-06-01 NOTE — Telephone Encounter (Signed)
OV note requested.

## 2016-06-02 ENCOUNTER — Encounter: Payer: Self-pay | Admitting: Family Medicine

## 2016-06-02 NOTE — Telephone Encounter (Signed)
Jocelyn Lamer- The eye exam was sent back in your folder yesterday.   Thanks, RLB

## 2016-06-05 ENCOUNTER — Ambulatory Visit (INDEPENDENT_AMBULATORY_CARE_PROVIDER_SITE_OTHER): Payer: Medicare Other | Admitting: Pulmonary Disease

## 2016-06-05 ENCOUNTER — Encounter: Payer: Self-pay | Admitting: Pulmonary Disease

## 2016-06-05 VITALS — BP 112/68 | HR 66 | Ht 62.0 in | Wt 130.0 lb

## 2016-06-05 DIAGNOSIS — J4540 Moderate persistent asthma, uncomplicated: Secondary | ICD-10-CM | POA: Diagnosis not present

## 2016-06-05 LAB — PULMONARY FUNCTION TEST
DL/VA % PRED: 78 %
DL/VA: 3.57 ml/min/mmHg/L
DLCO UNC: 12.05 ml/min/mmHg
DLCO unc % pred: 55 %
FEF 25-75 POST: 2.02 L/s
FEF 25-75 Pre: 1.49 L/sec
FEF2575-%Change-Post: 35 %
FEF2575-%PRED-PRE: 57 %
FEF2575-%Pred-Post: 77 %
FEV1-%Change-Post: 8 %
FEV1-%PRED-PRE: 74 %
FEV1-%Pred-Post: 80 %
FEV1-POST: 1.87 L
FEV1-Pre: 1.72 L
FEV1FVC-%Change-Post: 5 %
FEV1FVC-%PRED-PRE: 93 %
FEV6-%CHANGE-POST: 2 %
FEV6-%PRED-POST: 81 %
FEV6-%Pred-Pre: 79 %
FEV6-POST: 2.26 L
FEV6-Pre: 2.2 L
FEV6FVC-%CHANGE-POST: 0 %
FEV6FVC-%PRED-POST: 102 %
FEV6FVC-%Pred-Pre: 101 %
FVC-%Change-Post: 2 %
FVC-%Pred-Post: 80 %
FVC-%Pred-Pre: 78 %
FVC-Post: 2.27 L
FVC-Pre: 2.22 L
PRE FEV1/FVC RATIO: 78 %
PRE FEV6/FVC RATIO: 99 %
Post FEV1/FVC ratio: 82 %
Post FEV6/FVC ratio: 100 %
RV % pred: 88 %
RV: 1.36 L
TLC % PRED: 76 %
TLC: 3.63 L

## 2016-06-05 MED ORDER — FLUTICASONE FUROATE-VILANTEROL 200-25 MCG/INH IN AEPB: 1.0000 | INHALATION_SPRAY | Freq: Every day | RESPIRATORY_TRACT | Status: DC

## 2016-06-05 NOTE — Patient Instructions (Signed)
Continue using the Breo elipta. Return in 6 months

## 2016-06-05 NOTE — Progress Notes (Signed)
PFT done today. 

## 2016-06-05 NOTE — Progress Notes (Signed)
   Subjective:    Patient ID: Edger House, female    DOB: [birth year], 40 y.o.   MRN: [id number]  PROBLEM LIST Moderate persistent asthma  HPI Ms. Navarrette is a 40 year old with past medical history of moderate persistent asthma. She was diagnosed a few years ago. Her chief complaints are dyspnea on exertion at at rest associated with wheezing. She has cough nonproductive in nature. She has seasonal allergies but denies any rhinitis, postnasal drip, GERD. She was started [facility] and has noted significant improvement in symptoms since then. She has snoring at night, no witnessed apneas. She has daytime fatigue and tiredness.  DATA: PFTs 3/50/17 FVC 1.91 (66%) FEV1 1.37 (57%) F/F 72 Likely has mixed obstructive, restrictive physiology. Mod to severe reduction in FEV1  06/05/16 FVC 2.22 (38%) FEV1 1.72 [74%] F/F 78 TLC 76% DLCO 55%. Minimal obstruction and restriction. Reduced diffusion capacity that corrects for alveolar volume.  Labs FeNo 03/01/16- 7 IgE- 236 CBC with diff- Absolute eosinophil count 502cells/ microL  Imaging CXR 04/09/14 Significant scoliosis with rod fixation. No acute cardiopulmonary abnormality.  Social History: Half pack per day smoker for 20 years. She continues to smoke. No alcohol, drug use. She lives with her significant other. 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  Family history: Not significant.  Review of Systems Cough, dyspnea, wheezing. No sputum production, hemoptysis. No fevers, chills, loss of weight, loss of appetite. No nausea, vomiting, diarrhea, constipation. No chest pain, palpitation. All other review of systems are negative    Objective:   Physical Exam Blood pressure 112/68, pulse 66, height 5' 2"  (1.575 m), weight 130 lb (58.968 kg), last menstrual period 05/19/2016, SpO2 100 %. Gen: No apparent distress Neuro: No gross focal deficits. HEENT: No JVD, lymphadenopathy, thyromegaly. RS: Clear, No wheeze or  crackles CVS: S1-S2 heard, no murmurs rubs gallops. Abdomen: Soft, positive bowel sounds. Musculoskeletal: No edema.    Assessment & Plan:  #1 Asthma She is doing well on Breo with no exacerbation since last visit. She continues to use Flonase for her seasonal allergies. PFTs were reviewed with her which shows mixed mild obstruction and restriction. She likely has COPD from smoking and also a restrictive physiology from her scoliosis.  #2 Active smoker Smoking cessation encouraged and she want to try and quit on her own. She will need an eval for OSA. I will readdress this at next visit.   Plan: - Continue Breo - Smoking cessation.  Return in 6 months.  Marshell Garfinkel MD [facility] Pager [phone] If no answer or after 3pm call: [phone] 06/05/2016, 1:52 PM

## 2016-09-29 ENCOUNTER — Ambulatory Visit: Payer: Medicare Other | Admitting: Family Medicine

## 2016-10-06 ENCOUNTER — Ambulatory Visit: Payer: Medicare Other | Admitting: Family Medicine

## 2016-11-06 ENCOUNTER — Ambulatory Visit: Payer: Medicare Other | Admitting: Family Medicine

## 2016-12-04 ENCOUNTER — Ambulatory Visit: Payer: Medicare Other | Admitting: Family Medicine

## 2016-12-05 ENCOUNTER — Ambulatory Visit: Payer: Self-pay | Admitting: Pulmonary Disease

## 2016-12-06 ENCOUNTER — Ambulatory Visit (INDEPENDENT_AMBULATORY_CARE_PROVIDER_SITE_OTHER): Payer: Medicare HMO | Admitting: Family Medicine

## 2016-12-06 ENCOUNTER — Encounter: Payer: Self-pay | Admitting: Family Medicine

## 2016-12-06 VITALS — BP 120/78 | HR 96 | Wt 131.4 lb

## 2016-12-06 DIAGNOSIS — J4520 Mild intermittent asthma, uncomplicated: Secondary | ICD-10-CM

## 2016-12-06 DIAGNOSIS — F17200 Nicotine dependence, unspecified, uncomplicated: Secondary | ICD-10-CM

## 2016-12-06 DIAGNOSIS — R69 Illness, unspecified: Secondary | ICD-10-CM | POA: Diagnosis not present

## 2016-12-06 DIAGNOSIS — Z8739 Personal history of other diseases of the musculoskeletal system and connective tissue: Secondary | ICD-10-CM

## 2016-12-06 DIAGNOSIS — R7303 Prediabetes: Secondary | ICD-10-CM

## 2016-12-06 LAB — POCT GLYCOSYLATED HEMOGLOBIN (HGB A1C): Hemoglobin A1C: 5.9

## 2016-12-06 MED ORDER — IBUPROFEN 600 MG PO TABS: 600.0000 mg | ORAL_TABLET | Freq: Four times a day (QID) | ORAL | 2 refills | Status: DC | PRN

## 2016-12-06 MED ORDER — FLUTICASONE PROPIONATE 50 MCG/ACT NA SUSP: 2.0000 | Freq: Every day | NASAL | 2 refills | Status: DC

## 2016-12-06 MED ORDER — FLUTICASONE FUROATE-VILANTEROL 200-25 MCG/INH IN AEPB: 1.0000 | INHALATION_SPRAY | Freq: Every day | RESPIRATORY_TRACT | 5 refills | Status: DC

## 2016-12-06 MED ORDER — ALBUTEROL SULFATE HFA 108 (90 BASE) MCG/ACT IN AERS: 1.0000 | INHALATION_SPRAY | Freq: Four times a day (QID) | RESPIRATORY_TRACT | 2 refills | Status: DC | PRN

## 2016-12-06 NOTE — Progress Notes (Signed)
   Subjective:    Patient ID: Robin Richards, female    DOB: 1976/06/17, 41 y.o.   MRN: [id number]  HPI Chief Complaint  Patient presents with  . 4 month follow-up    4 month follow-up, checks blood sugar as needed   She is here for a 4 month follow up on prediabetes.  She is not checking her blood blood sugar. States her diet is healthier and she is eating more baked food and salads and not as much sugar.  Has been doing some walking for exercise. Increased her activity level.   She has a history of moderate asthma. No flares. Has been seeing pulmonologist for asthma but states she missed her appointment with them yesterday. Plans to call and reschedule.  Denies fever, chills, fatigue, dizziness, cough, congestion, wheezing, shortness of breath, abdominal pain, GI or GU symptoms.   Reports cutting back on smoking. States she has been smoking 1-2 cigarettes per day for the past 2 months and some days does not smoke at all. Plans to stop smoking completely.   Would like to have a "check up" on her back. She reports having surgery with a rod placed for scoliosis 10 years ago in [place].  Denies any pain or issues with her back.   Takes Ibuprofen as needed for headaches or menstrual cramps. Rarely has to take this for back pain.   Requests refill on medications.    LMP: 11/30/2015.  Contraception: denies having sex with men.   Reviewed allergies, medications, past medical, surgical, and social history.    Review of Systems Pertinent positives and negatives in the history of present illness.     Objective:   Physical Exam BP 120/78   Pulse 96   Wt 131 lb 6.4 oz (59.6 kg)   SpO2 98%   BMI 24.03 kg/m  Alert and in no distress. Cardiac exam shows a regular sinus rhythm without murmurs or gallops. Lungs are clear to auscultation. Back with scar noted midline, significant curvature present, no erythema, non tender, full ROM to lumbar spine.   Hemoglobin A1C 5.9%       Assessment & Plan:  Prediabetes - Plan: HgB A1c  Smoker  History of scoliosis  Mild intermittent asthma without complication  She is still in prediabetes range and stable. Discussed continuing to eat healthy and limiting simple sugars and carbohydrates. Encouraged her to continue exercising.  Discussed that her back is not currently bothering her and her exam is not worrisome. No further evaluation or treatment needed unless she starts having pain or symptoms.  Congratulated her on cutting back on smoking and advised her to stop.  She will call and reschedule with her pulmonologist. Asthma appears well controlled on daily Breo and Flonase and she is not needing her albuterol inhaler.  Refilled medications per patient request.  Plan to have her return in 4 months for a CPE and fasting labs.

## 2016-12-06 NOTE — Patient Instructions (Addendum)
Call and schedule your follow up appointment with Dr. Shanon Payor, pulmonologist.   Continue taking daily Breo and flonase nasal spray.   Congratulations on cutting back on smoking. Stop smoking completely when you can.   Your blood sugars are still in prediabetes range and I recommend continuing with limiting the carbohydrates and sugar in your diet. Keep walking for exercise.   Follow up with me in 4 months for a physical and fasting labs. (do not eat or drink anything except water 8 hours before your appointment).

## 2016-12-08 ENCOUNTER — Ambulatory Visit (INDEPENDENT_AMBULATORY_CARE_PROVIDER_SITE_OTHER): Payer: Self-pay | Admitting: Pulmonary Disease

## 2016-12-08 ENCOUNTER — Telehealth: Payer: Self-pay | Admitting: Internal Medicine

## 2016-12-08 ENCOUNTER — Encounter: Payer: Self-pay | Admitting: Pulmonary Disease

## 2016-12-08 VITALS — BP 104/68 | HR 87 | Ht 64.0 in | Wt 162.8 lb

## 2016-12-08 DIAGNOSIS — J4540 Moderate persistent asthma, uncomplicated: Secondary | ICD-10-CM

## 2016-12-08 NOTE — Patient Instructions (Signed)
Please continue to work on smoking cessation. We will see if we can get samples of breo and work on Production designer, theatre/television/film for cheaper inhalers.  Return to clinic in 6 months

## 2016-12-08 NOTE — Progress Notes (Signed)
Robin Richards    [id number]    15-Mar-1976  Primary Care Physician:Vickie Raenette Rover, NP  Referring Physician: Girtha Rm, NP [address] [place], [place] [address]  Chief complaint:   Follow up for  Moderate persistent asthma Active smoker  HPI: Ms. Mullane is a 41 year old with past medical history of moderate persistent asthma. She was diagnosed a few years ago. Her chief complaints are dyspnea on exertion at at rest associated with wheezing. She has cough nonproductive in nature. She has seasonal allergies but denies any rhinitis, postnasal drip, GERD. She has snoring at night, no witnessed apneas. She has daytime fatigue and tiredness.  Interim History: She has been maintained Breo with improvement in symptoms. However she cannot afford the $50 co-pay and is requesting a cheaper alternative. She continues to smoke.   Outpatient Encounter Prescriptions as of 12/08/2016  Medication Sig  . fluticasone (FLONASE) 50 MCG/ACT nasal spray Place 2 sprays into both nostrils daily.  Marland Kitchen ibuprofen (ADVIL,MOTRIN) 600 MG tablet Take 1 tablet (600 mg total) by mouth every 6 (six) hours as needed for fever, headache or mild pain. Reported on 02/23/2016  . albuterol (PROVENTIL HFA;VENTOLIN HFA) 108 (90 Base) MCG/ACT inhaler Inhale 1-2 puffs into the lungs every 6 (six) hours as needed for wheezing or shortness of breath. (Patient not taking: Reported on 12/08/2016)  . fluticasone furoate-vilanterol (BREO ELLIPTA) 200-25 MCG/INH AEPB Inhale 1 puff into the lungs daily. (Patient not taking: Reported on 12/08/2016)   No facility-administered encounter medications on file as of 12/08/2016.     Allergies as of 12/08/2016  . (No Known Allergies)    Past Medical History:  Diagnosis Date  . Arthritis   . Asthma   . Elevated hemoglobin A1c   . Heartburn   . Scoliosis     Past Surgical History:  Procedure Laterality Date  . SPINE SURGERY      History reviewed. No pertinent family  history.  Social History   Social History  . Marital status: Single    Spouse name: N/A  . Number of children: N/A  . Years of education: N/A   Occupational History  . Not on file.   Social History Main Topics  . Smoking status: Current Some Day Smoker    Packs/day: 0.50    Years: 20.00    Types: Cigarettes    Last attempt to quit: 05/24/2016  . Smokeless tobacco: Never Used  . Alcohol use No  . Drug use: No  . Sexual activity: Not Currently    Partners: Female   Other Topics Concern  . Not on file   Social History Narrative  . No narrative on file     Review of systems: Review of Systems  Constitutional: Negative for fever and chills.  HENT: Negative.   Eyes: Negative for blurred vision.  Respiratory: as per HPI  Cardiovascular: Negative for chest pain and palpitations.  Gastrointestinal: Negative for vomiting, diarrhea, blood per rectum. Genitourinary: Negative for dysuria, urgency, frequency and hematuria.  Musculoskeletal: Negative for myalgias, back pain and joint pain.  Skin: Negative for itching and rash.  Neurological: Negative for dizziness, tremors, focal weakness, seizures and loss of consciousness.  Endo/Heme/Allergies: Negative for environmental allergies.  Psychiatric/Behavioral: Negative for depression, suicidal ideas and hallucinations.  All other systems reviewed and are negative.   Physical Exam: Blood pressure 104/68, pulse 87, height 5' 4"  (1.626 m), weight 162 lb 12.8 oz (73.8 kg), SpO2 97 %. Gen:  No acute distress HEENT:  EOMI, sclera anicteric Neck:     No masses; no thyromegaly Lungs:    Clear to auscultation bilaterally; normal respiratory effort CV:         Regular rate and rhythm; no murmurs Abd:      + bowel sounds; soft, non-tender; no palpable masses, no distension Ext:    No edema; adequate peripheral perfusion Skin:      Warm and dry; no rash Neuro: alert and oriented x 3 Psych: normal mood and affect  Data  Reviewed: PFTs 3/50/17 FVC 1.91 (66%) FEV1 1.37 (57%) F/F 72 Likely has mixed obstructive, restrictive physiology. Mod to severe reduction in FEV1  06/05/16 FVC 2.22 (38%) FEV1 1.72 [74%] F/F 78 TLC 76% DLCO 55%. Minimal obstruction and restriction. Reduced diffusion capacity that corrects for alveolar volume.  Labs FeNo 03/01/16- 7 IgE- 236 CBC with diff- Absolute eosinophil count 502cells/ microL  Imaging CXR 04/09/14 Significant scoliosis with rod fixation. No acute cardiopulmonary abnormality. Images reviewed.  Assessment:  #1 Asthma She is doing well on Breo with no exacerbation since last visit. She continues to use Flonase for her seasonal allergies. She is unable to afford her inhalers. We will give her samples and see if we can get her on some kind of an assistance program to get her inhalers. She likely has COPD from smoking and also a restrictive physiology from her scoliosis.  #2 Active smoker Smoking cessation encouraged and she want to try and quit on her own. She will need an eval for OSA. I will readdress this at next visit.   Plan/Recommendations: - Continue Breo - Smoking cessation.   Marshell Garfinkel MD [facility] Pager [phone] 12/08/2016, 12:25 PM  CC: Girtha Rm, NP

## 2016-12-08 NOTE — Telephone Encounter (Signed)
Pt called to states that her breo and albuterol inhaler are both $50 and she can not afford that. She wants to know if something else can be sent in. Uses [facility]

## 2016-12-09 NOTE — Telephone Encounter (Signed)
I recommend she call her pulmonologist about this. It appear that she just had an appointment with them per the chart. Please find out. Thanks.

## 2016-12-11 NOTE — Telephone Encounter (Signed)
Looks like pulmongist is aware of inhaler expense and will work with patient assistance on it

## 2017-04-11 ENCOUNTER — Ambulatory Visit: Payer: Medicare HMO | Admitting: Family Medicine

## 2017-04-19 ENCOUNTER — Ambulatory Visit: Payer: Medicare HMO | Admitting: Family Medicine

## 2017-04-19 ENCOUNTER — Ambulatory Visit (INDEPENDENT_AMBULATORY_CARE_PROVIDER_SITE_OTHER): Payer: Medicare HMO | Admitting: Family Medicine

## 2017-04-19 ENCOUNTER — Encounter: Payer: Self-pay | Admitting: Family Medicine

## 2017-04-19 VITALS — BP 110/60 | HR 100 | Ht 62.0 in | Wt 118.4 lb

## 2017-04-19 DIAGNOSIS — R7303 Prediabetes: Secondary | ICD-10-CM

## 2017-04-19 DIAGNOSIS — J302 Other seasonal allergic rhinitis: Secondary | ICD-10-CM | POA: Diagnosis not present

## 2017-04-19 DIAGNOSIS — Z8639 Personal history of other endocrine, nutritional and metabolic disease: Secondary | ICD-10-CM

## 2017-04-19 DIAGNOSIS — Z23 Encounter for immunization: Secondary | ICD-10-CM | POA: Diagnosis not present

## 2017-04-19 DIAGNOSIS — M25512 Pain in left shoulder: Secondary | ICD-10-CM

## 2017-04-19 DIAGNOSIS — Z113 Encounter for screening for infections with a predominantly sexual mode of transmission: Secondary | ICD-10-CM | POA: Diagnosis not present

## 2017-04-19 DIAGNOSIS — Z8349 Family history of other endocrine, nutritional and metabolic diseases: Secondary | ICD-10-CM | POA: Diagnosis not present

## 2017-04-19 DIAGNOSIS — Z1231 Encounter for screening mammogram for malignant neoplasm of breast: Secondary | ICD-10-CM | POA: Diagnosis not present

## 2017-04-19 DIAGNOSIS — R634 Abnormal weight loss: Secondary | ICD-10-CM

## 2017-04-19 DIAGNOSIS — J4540 Moderate persistent asthma, uncomplicated: Secondary | ICD-10-CM

## 2017-04-19 DIAGNOSIS — Z0000 Encounter for general adult medical examination without abnormal findings: Secondary | ICD-10-CM | POA: Diagnosis not present

## 2017-04-19 DIAGNOSIS — Z1239 Encounter for other screening for malignant neoplasm of breast: Secondary | ICD-10-CM

## 2017-04-19 DIAGNOSIS — R69 Illness, unspecified: Secondary | ICD-10-CM | POA: Diagnosis not present

## 2017-04-19 LAB — LIPID PANEL
CHOL/HDL RATIO: 3 ratio (ref <5.0)
CHOLESTEROL: 181 mg/dL (ref <200)
HDL: 60 mg/dL (ref >50)
LDL CALC: 108 mg/dL — AB (ref <100)
TRIGLYCERIDES: 64 mg/dL (ref <150)
VLDL: 13 mg/dL (ref <30)

## 2017-04-19 LAB — COMPREHENSIVE METABOLIC PANEL
ALK PHOS: 96 U/L (ref 33–115)
ALT: 10 U/L (ref 6–29)
AST: 13 U/L (ref 10–30)
Albumin: 4.3 g/dL (ref 3.6–5.1)
BILIRUBIN TOTAL: 0.6 mg/dL (ref 0.2–1.2)
BUN: 13 mg/dL (ref 7–25)
CALCIUM: 9.7 mg/dL (ref 8.6–10.2)
CO2: 25 mmol/L (ref 20–31)
CREATININE: 0.83 mg/dL (ref 0.50–1.10)
Chloride: 108 mmol/L (ref 98–110)
GLUCOSE: 90 mg/dL (ref 65–99)
Potassium: 4.6 mmol/L (ref 3.5–5.3)
SODIUM: 140 mmol/L (ref 135–146)
Total Protein: 6.9 g/dL (ref 6.1–8.1)

## 2017-04-19 LAB — CBC WITH DIFFERENTIAL/PLATELET
BASOS PCT: 0 %
Basophils Absolute: 0 cells/uL (ref 0–200)
EOS PCT: 3 %
Eosinophils Absolute: 279 cells/uL (ref 15–500)
HEMATOCRIT: 42.9 % (ref 35.0–45.0)
Hemoglobin: 13.8 g/dL (ref 11.7–15.5)
LYMPHS ABS: 3162 {cells}/uL (ref 850–3900)
LYMPHS PCT: 34 %
MCH: 23.4 pg — ABNORMAL LOW (ref 27.0–33.0)
MCHC: 32.2 g/dL (ref 32.0–36.0)
MCV: 72.8 fL — ABNORMAL LOW (ref 80.0–100.0)
MONO ABS: 558 {cells}/uL (ref 200–950)
MPV: 9.8 fL (ref 7.5–12.5)
Monocytes Relative: 6 %
Neutro Abs: 5301 cells/uL (ref 1500–7800)
Neutrophils Relative %: 57 %
PLATELETS: 292 10*3/uL (ref 140–400)
RBC: 5.89 MIL/uL — ABNORMAL HIGH (ref 3.80–5.10)
RDW: 14.5 % (ref 11.0–15.0)
WBC: 9.3 10*3/uL (ref 4.0–10.5)

## 2017-04-19 LAB — TSH: TSH: 0.63 mIU/L

## 2017-04-19 NOTE — Progress Notes (Signed)
Robin Richards is a 41 y.o. female who presents for annual wellness visit and follow-up on chronic medical conditions.  She has the following concerns:  Left shoulder pain for the past 3 days. Pain is anterior and posterior and worse with certain movements. No numbness, tingling, weakness. Has had intermittent pain in her left shoulder in the past and has been seen for this. She did not go to PT as recommended in the past.   Using Breo and Flonase daily. States her asthma is well controlled, no recent flares. Due to see her pulmonologist for this in June.  Albuterol inhaler - use once daily 4-5 days per week with walking long distances.   She stopped smoking 2 months ago.   Immunization History  Administered Date(s) Administered  . Pneumococcal Conjugate-13 04/19/2017   Last Pap smear: Apr 09 2015  Last mammogram: Apr 16 2015 Last colonoscopy: never  Last DEXA: never  Dentist: years  Ophtho: 2016  Exercise: walks daily   Lives alone. Does not work.   Other doctors caring for patient include: Dr. Hart Richards - Pulmonologist   Depression screen:  See questionnaire below.  Depression screen PHQ 2/9 04/19/2017  Decreased Interest 0  Down, Depressed, Hopeless 0  PHQ - 2 Score 0    Fall Risk Screen: see questionnaire below. Fall Risk  04/19/2017  Falls in the past year? No    ADL screen:  See questionnaire below Functional Status Survey: Is the patient deaf or have difficulty hearing?: Yes Does the patient have difficulty seeing, even when wearing glasses/contacts?: No Does the patient have difficulty concentrating, remembering, or making decisions?: No Does the patient have difficulty walking or climbing stairs?: No Does the patient have difficulty dressing or bathing?: No Does the patient have difficulty doing errands alone such as visiting a doctor's office or shopping?: No   End of Life Discussion:  Patient does not have a living will and medical power of attorney. Paperwork  given and discussed. MOST form filled out with patient.   Review of Systems Constitutional: -fever, -chills, -sweats, -unexpected weight change, -anorexia, -fatigue Allergy: -sneezing, -itching, -congestion Dermatology: denies changing moles, rash, lumps, new worrisome lesions ENT: -runny nose, -ear pain, -sore throat, -hoarseness, -sinus pain, -teeth pain, -tinnitus, -hearing loss, -epistaxis Cardiology:  -chest pain, -palpitations, -edema, -orthopnea, -paroxysmal nocturnal dyspnea Respiratory: -cough, -shortness of breath, -dyspnea on exertion, -wheezing, -hemoptysis Gastroenterology: -abdominal pain, -nausea, -vomiting, -diarrhea, -constipation, -blood in stool, -changes in bowel movement, -dysphagia Hematology: -bleeding or bruising problems Musculoskeletal: -arthralgias, -myalgias, -joint swelling, -back pain, -neck pain, -cramping, -gait changes Ophthalmology: -vision changes, -eye redness, -itching, -discharge Urology: -dysuria, -difficulty urinating, -hematuria, -urinary frequency, -urgency, incontinence Neurology: -headache, -weakness, -tingling, -numbness, -speech abnormality, -memory loss, -falls, -dizziness Psychology:  -depressed mood, -agitation, -sleep problems    PHYSICAL EXAM:  BP 110/60   Pulse 100   Ht 5' 2"  (1.575 m)   Wt 118 lb 6.4 oz (53.7 kg)   LMP 03/24/2017   BMI 21.66 kg/m   General Appearance: Alert, cooperative, no distress, appears stated age Head: Normocephalic, without obvious abnormality, atraumatic Eyes: PERRL, conjunctiva/corneas clear, EOM's intact, fundi benign Ears: Normal TM's and external ear canals Nose: Nares normal, mucosa normal, no drainage or sinus   tenderness Throat: Lips, mucosa, and tongue normal; teeth and gums normal Neck: Supple, no lymphadenopathy; thyroid: no enlargement/tenderness/nodules; no carotid bruit or JVD Back: Spine nontender, curvature to spine with a scar from back surgery, ROM normal, no CVA tenderness Lungs:  Clear to auscultation  bilaterally without wheezes, rales or ronchi; respirations unlabored Chest Wall: No tenderness or deformity Heart: Regular rate and rhythm, S1 and S2 normal, no murmur, rub or gallop Breast Exam: refused. Sent for mammogram.  Abdomen: Soft, non-tender, nondistended, normoactive bowel sounds, no masses, no hepatosplenomegaly Genitalia: refused. Up to date on pap smear.  Extremities: No clubbing, cyanosis or edema Pulses: 2+ and symmetric all extremities Skin: Skin color, texture, turgor normal, no rashes or lesions. Multiple scars from rash years ago to bilateral upper and lower extremities.  Lymph nodes: Cervical, supraclavicular, and axillary nodes normal Neurologic: CNII-XII intact, normal strength, sensation and gait; reflexes 2+ and symmetric throughout Psych: Normal mood, affect, hygiene and grooming.  ASSESSMENT/PLAN: Medicare annual wellness visit, subsequent  Seasonal allergic rhinitis, unspecified trigger  Moderate persistent asthma without complication  Family history of thyroid disease in sister - Plan: TSH  Unintentional weight loss - Plan: CBC with Differential/Platelet, Comprehensive metabolic panel, TSH  Prediabetes - Plan: Comprehensive metabolic panel, Hemoglobin A1c  Screening for breast cancer - Plan: MM DIGITAL SCREENING BILATERAL  Screening for STD (sexually transmitted disease) - Plan: RPR, HIV antibody, GC/Chlamydia Probe Amp  History of hyperlipidemia - Plan: Lipid panel  Need for pneumococcal vaccination - Plan: Pneumococcal conjugate vaccine 13-valent  Acute pain of left shoulder  Plan to treat shoulder pain conservatively for the next 2-3 weeks with Aleve (samples given) and heat or ice. She will let me know if not improving and we will consider an XR and PT.  She attributes weight loss to walking more, appetite is good per patient. Will check labs.  Prediabetes and hyperlipidemia- check labs. Counseled on diet and exercise.  STD  screening done per patient request.  prevnar 13 given.  Mammogram ordered.  Asthma appears to be well managed with current medications. She stopped smoking. encouraged her to not start back. She will follow up with Dr. Melvyn Novas next month.  She will return advanced directives at her convenience. Counseling done.   Follow up pending labs.    Discussed monthly self breast exams and yearly mammograms; at least 30 minutes of aerobic activity at least 5 days/week and weight-bearing exercise 2x/week; proper sunscreen use reviewed; healthy diet, including goals of calcium and vitamin D intake and alcohol recommendations (less than or equal to 1 drink/day) reviewed; regular seatbelt use; changing batteries in smoke detectors.  Immunization recommendations discussed.  Colonoscopy recommendations reviewed   Medicare Attestation I have personally reviewed: The patient's medical and social history Their use of alcohol, tobacco or illicit drugs Their current medications and supplements The patient's functional ability including ADLs,fall risks, home safety risks, cognitive, and hearing and visual impairment Diet and physical activities Evidence for depression or mood disorders  The patient's weight, height, and BMI have been recorded in the chart.  I have made referrals, counseling, and provided education to the patient based on review of the above and I have provided the patient with a written personalized care plan for preventive services.     Harland Dingwall, NP-C   04/20/2017

## 2017-04-19 NOTE — Patient Instructions (Addendum)
Call and schedule your mammogram at the breast center.   Stop taking Ibuprofen for now and take the Aleve 2 tablets twice daily with food and see if this helps with your left shoulder. If the pain gets worse or is not improving in the next 2 weeks then let me know.   Take the prescription for your tetanus shot (Tdap) to the pharmacy to get this. Let us know when and where you do this.   Call and schedule an appointment with your pulmonologist for July. [facility]  Congratulations on stopping smoking.  Make sure you are eating a well balanced diet and continue walking for exercise.  If you decide to fill out the advance directives, bring them in for [place] to fill out and scan into your chart.   We will call you with your lab results.

## 2017-04-20 LAB — RPR

## 2017-04-20 LAB — HEMOGLOBIN A1C
Hgb A1c MFr Bld: 5.5 % (ref <5.7)
Mean Plasma Glucose: 111 mg/dL

## 2017-04-20 LAB — HIV ANTIBODY (ROUTINE TESTING W REFLEX): HIV 1&2 Ab, 4th Generation: NONREACTIVE

## 2017-04-20 LAB — GC/CHLAMYDIA PROBE AMP
CT PROBE, AMP APTIMA: NOT DETECTED
GC Probe RNA: NOT DETECTED

## 2017-04-24 DIAGNOSIS — R69 Illness, unspecified: Secondary | ICD-10-CM | POA: Diagnosis not present

## 2017-04-24 DIAGNOSIS — H9113 Presbycusis, bilateral: Secondary | ICD-10-CM | POA: Diagnosis not present

## 2017-04-24 DIAGNOSIS — R062 Wheezing: Secondary | ICD-10-CM | POA: Diagnosis not present

## 2017-04-24 DIAGNOSIS — M47819 Spondylosis without myelopathy or radiculopathy, site unspecified: Secondary | ICD-10-CM | POA: Diagnosis not present

## 2017-04-24 DIAGNOSIS — M159 Polyosteoarthritis, unspecified: Secondary | ICD-10-CM | POA: Diagnosis not present

## 2017-04-24 DIAGNOSIS — Z6821 Body mass index (BMI) 21.0-21.9, adult: Secondary | ICD-10-CM | POA: Diagnosis not present

## 2017-04-24 DIAGNOSIS — H9311 Tinnitus, right ear: Secondary | ICD-10-CM | POA: Diagnosis not present

## 2017-04-24 DIAGNOSIS — Z791 Long term (current) use of non-steroidal anti-inflammatories (NSAID): Secondary | ICD-10-CM | POA: Diagnosis not present

## 2017-04-24 DIAGNOSIS — Z0000 Encounter for general adult medical examination without abnormal findings: Secondary | ICD-10-CM | POA: Diagnosis not present

## 2017-04-24 DIAGNOSIS — M47812 Spondylosis without myelopathy or radiculopathy, cervical region: Secondary | ICD-10-CM | POA: Diagnosis not present

## 2017-05-11 ENCOUNTER — Ambulatory Visit
Admission: RE | Admit: 2017-05-11 | Discharge: 2017-05-11 | Disposition: A | Discharge status: Home / Self Care | Payer: Medicare HMO | Source: Ambulatory Visit | Attending: Family Medicine | Admitting: Family Medicine

## 2017-05-11 DIAGNOSIS — Z1231 Encounter for screening mammogram for malignant neoplasm of breast: Secondary | ICD-10-CM | POA: Diagnosis not present

## 2017-05-11 DIAGNOSIS — Z1239 Encounter for other screening for malignant neoplasm of breast: Secondary | ICD-10-CM

## 2017-06-28 IMAGING — CR DG CHEST 2V
2 series · 2 of 2 positions shown · non-contrast
Comparison: Prior radiograph from 04/09/2014.

CLINICAL DATA: Initial evaluation for acute shortness of breath.

EXAM:
CHEST  2 VIEW

[w chest pa]
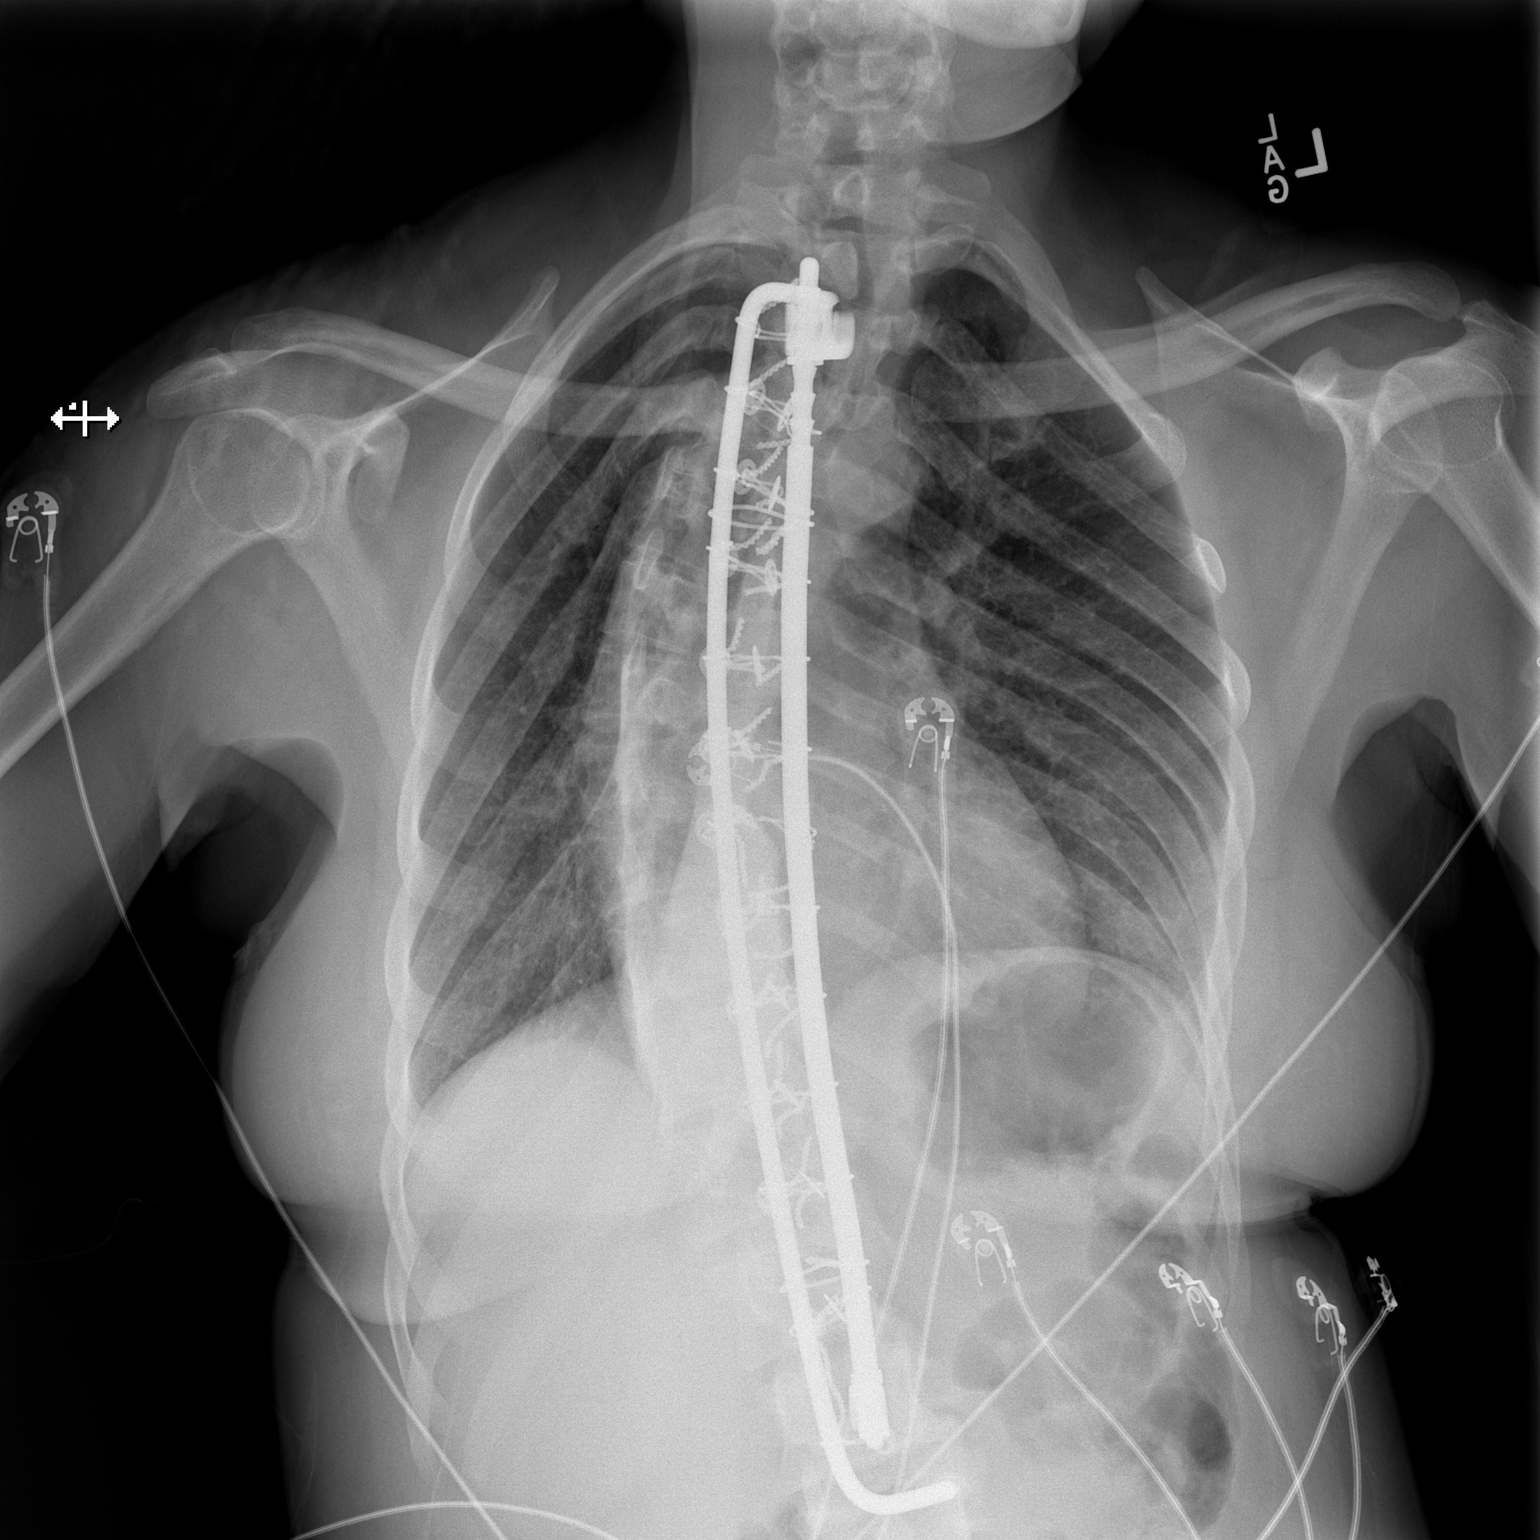

[w chest lat]
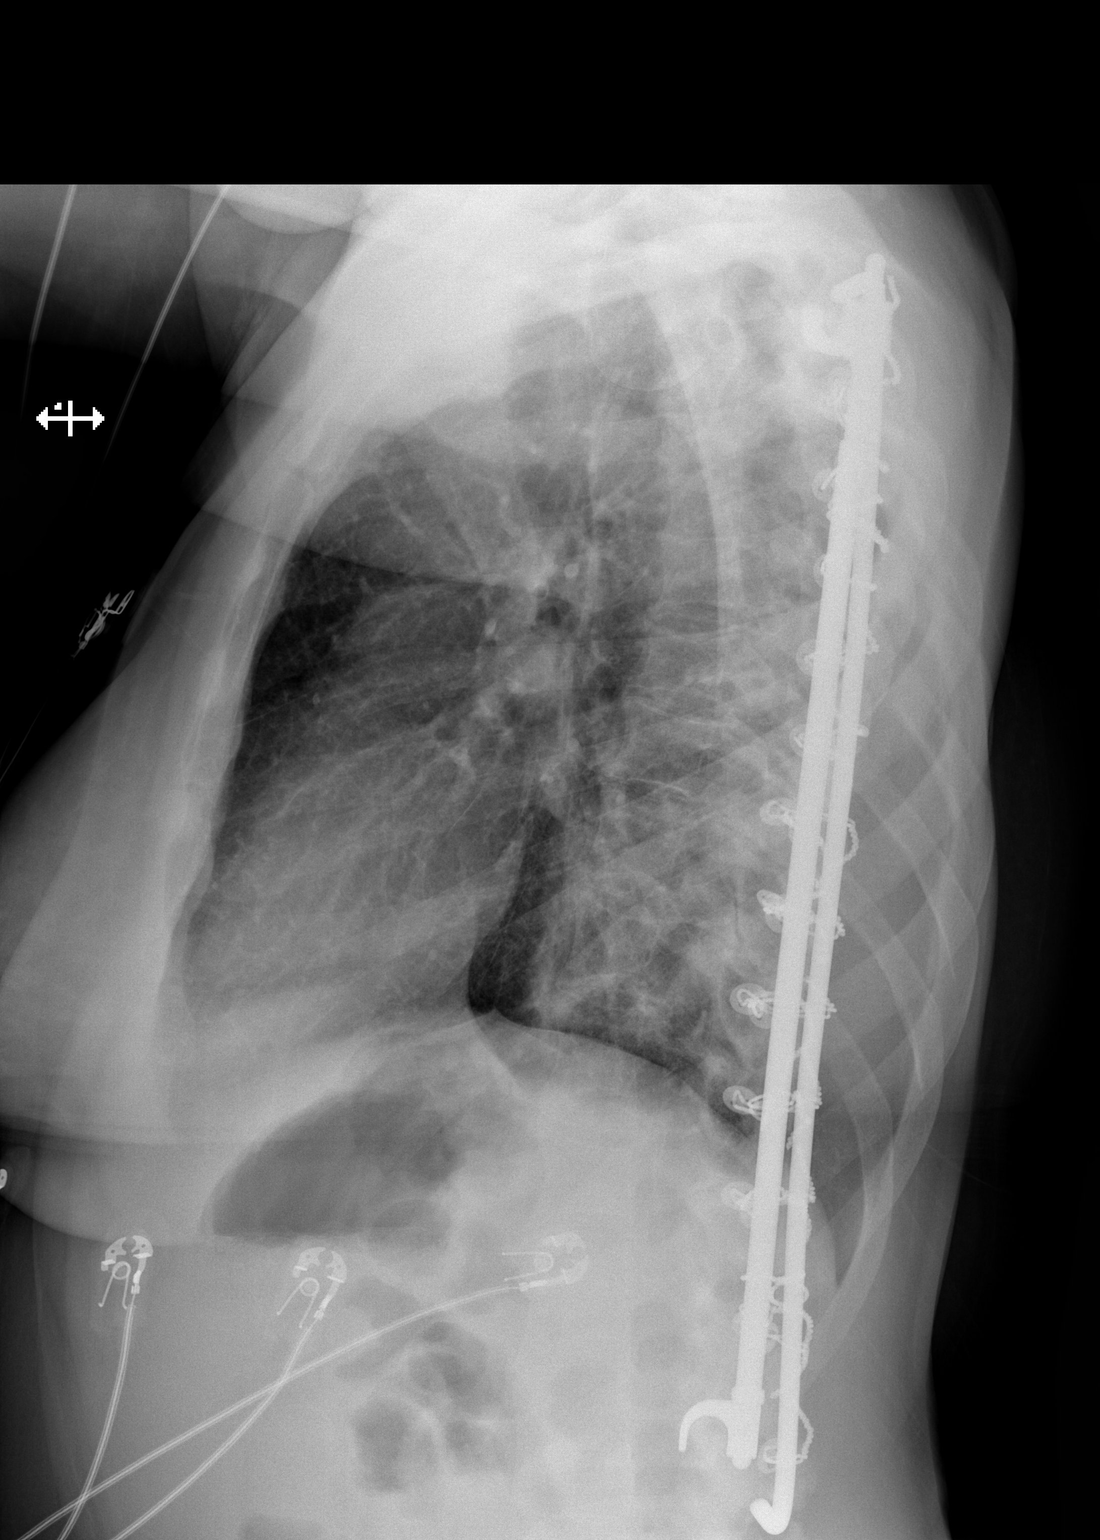

[2 of 2 positions shown; findings below may reference images not displayed]

FINDINGS: Cardiac and mediastinal silhouettes are stable. Bowing of the
tracheal air column is unchanged.

Lungs normally inflated. No focal infiltrate, pulmonary edema, or
pleural effusion. Mild scattered peribronchial thickening. No
pneumothorax.

Scoliosis with Harrington fixation rods in place. No acute osseous
abnormality.
IMPRESSION: 1. Subtle diffuse peribronchial thickening, which may reflect
sequela of reactive airways disease or acute bronchiolitis. No
consolidative airspace opacity or other abnormality identified.
2. Scoliosis with Harrington fixation rods in place.

## 2017-07-21 ENCOUNTER — Emergency Department (HOSPITAL_COMMUNITY): Payer: Medicare HMO

## 2017-07-21 ENCOUNTER — Encounter (HOSPITAL_COMMUNITY): Payer: Self-pay | Admitting: Emergency Medicine

## 2017-07-21 ENCOUNTER — Emergency Department (HOSPITAL_COMMUNITY)
Admission: EM | Admit: 2017-07-21 | Discharge: 2017-07-21 | Disposition: A | Discharge status: Home / Self Care | Payer: Medicare HMO | Attending: Emergency Medicine | Admitting: Emergency Medicine

## 2017-07-21 DIAGNOSIS — M546 Pain in thoracic spine: Secondary | ICD-10-CM | POA: Insufficient documentation

## 2017-07-21 DIAGNOSIS — Z79899 Other long term (current) drug therapy: Secondary | ICD-10-CM | POA: Diagnosis not present

## 2017-07-21 DIAGNOSIS — G8929 Other chronic pain: Secondary | ICD-10-CM | POA: Insufficient documentation

## 2017-07-21 DIAGNOSIS — M419 Scoliosis, unspecified: Secondary | ICD-10-CM | POA: Insufficient documentation

## 2017-07-21 DIAGNOSIS — Z87891 Personal history of nicotine dependence: Secondary | ICD-10-CM | POA: Diagnosis not present

## 2017-07-21 DIAGNOSIS — J45909 Unspecified asthma, uncomplicated: Secondary | ICD-10-CM | POA: Diagnosis not present

## 2017-07-21 LAB — PREGNANCY, URINE: Preg Test, Ur: NEGATIVE

## 2017-07-21 MED ORDER — METHOCARBAMOL 500 MG PO TABS: 500.0000 mg | ORAL_TABLET | Freq: Two times a day (BID) | ORAL | 0 refills | Status: DC

## 2017-07-21 NOTE — Discharge Instructions (Signed)
Please read attached information regarding your  condition. Take Robaxin twice daily. Continue anti-inflammatories such as ibuprofen or Aleve. Follow-up with PCP for further evaluation. Return to ED for worsening pain, numbness, weakness, trouble walking, injuries, loss of bladder function.

## 2017-07-21 NOTE — ED Triage Notes (Signed)
Pt c/o upper back pain, started 4 days ago-- hx scolioisis with surgery many yeaars ago per pt. Unrelieved with OTC meds.

## 2017-07-21 NOTE — ED Provider Notes (Signed)
[facility] DEPT Provider Note   CSN: [id number] Arrival date & time: 07/21/17  6073     History   Chief Complaint Chief Complaint  Patient presents with  . Back Pain    HPI Robin Richards is a 41 y.o. female.  HPI  Patient, with past medical history of scoliosis and arthritis, presents to ED for evaluation of mid upper back pain for the past 3 days. She states that she does have a history of scoliosis which required surgery when she was a child. However she states that she is having increasing pain for the past 3 days. She denies any injury, physical exercise or lifting that brought the pain on. She has tried Advil and Aleve with some relief in her symptoms. She is able to walk. She denies any falls. She denies any urinary incontinence, IV drug use, history of cancer, numbness, weakness, fever, urinary symptoms.  Past Medical History:  Diagnosis Date  . Arthritis   . Asthma   . Elevated hemoglobin A1c   . Heartburn   . Scoliosis     Patient Active Problem List   Diagnosis Date Noted  . Seasonal allergies 02/23/2016  . Asthma, moderate persistent 02/23/2016  . Smoker 02/23/2016  . Prediabetes 04/09/2015  . Well woman exam 04/09/2015    Past Surgical History:  Procedure Laterality Date  . SPINE SURGERY      OB History    No data available       Home Medications    Prior to Admission medications   Medication Sig Start Date End Date Taking? Authorizing Provider  albuterol (PROVENTIL HFA;VENTOLIN HFA) 108 (90 Base) MCG/ACT inhaler Inhale 1-2 puffs into the lungs every 6 (six) hours as needed for wheezing or shortness of breath. 12/06/16   Henson, Vickie L, NP-C  fluticasone (FLONASE) 50 MCG/ACT nasal spray Place 2 sprays into both nostrils daily. 12/06/16   Henson, Vickie L, NP-C  fluticasone furoate-vilanterol (BREO ELLIPTA) 200-25 MCG/INH AEPB Inhale 1 puff into the lungs daily. 12/06/16   Henson, Vickie L, NP-C  ibuprofen (ADVIL,MOTRIN) 600 MG tablet Take 1  tablet (600 mg total) by mouth every 6 (six) hours as needed for fever, headache or mild pain. Reported on 02/23/2016 12/06/16   Girtha Rm, NP-C  methocarbamol (ROBAXIN) 500 MG tablet Take 1 tablet (500 mg total) by mouth 2 (two) times daily. 07/21/17   Delia Heady, PA-C    Family History Family History  Problem Relation Age of Onset  . Cirrhosis Mother   . Cancer Father   . Hypertension Sister   . Thyroid disease Sister   . Breast cancer Maternal Grandmother     Social History Social History  Substance Use Topics  . Smoking status: Former Smoker    Packs/day: 0.50    Years: 20.00    Types: Cigarettes    Quit date: 02/17/2017  . Smokeless tobacco: Never Used  . Alcohol use No     Allergies   Patient has no known allergies.   Review of Systems Review of Systems  Constitutional: Negative for chills and fever.  Genitourinary: Negative for dysuria and urgency.  Musculoskeletal: Positive for back pain. Negative for arthralgias, myalgias, neck pain and neck stiffness.  Neurological: Negative for weakness and numbness.     Physical Exam Updated Vital Signs BP 108/87 (BP Location: Left Arm)   Pulse 77   Temp 98.1 F (36.7 C) (Oral)   Resp 16   Ht 5' 2"  (1.575 m)   Wt  53.5 kg (118 lb)   LMP 07/15/2017   SpO2 99%   BMI 21.58 kg/m   Physical Exam  Constitutional: She appears well-developed and well-nourished. No distress.  HENT:  Head: Normocephalic and atraumatic.  Eyes: Conjunctivae and EOM are normal. No scleral icterus.  Neck: Normal range of motion.  Pulmonary/Chest: Effort normal. No respiratory distress.  Musculoskeletal:       Arms: There is bilateral trapezius musculature tenderness as well as midline thoracic spinal tenderness as indicated and image. No midline spinal tenderness present in lumbar or cervical spine. No step-off palpated. No visible bruising, edema or temperature change noted. No objective signs of numbness present. No saddle  anesthesia. 2+ DP pulses bilaterally. Sensation intact to light touch. Strength 5/5 in bilateral lower extremities.  Neurological: She is alert.  Skin: No rash noted. She is not diaphoretic.  Psychiatric: She has a normal mood and affect.  Nursing note and vitals reviewed.    ED Treatments / Results  Labs (all labs ordered are listed, but only abnormal results are displayed) Labs Reviewed  PREGNANCY, URINE    EKG  EKG Interpretation None       Radiology Dg Thoracic Spine 2 View  Result Date: 07/21/2017 CLINICAL DATA:  Upper and midthoracic pain.  History of scoliosis. EXAM: THORACIC SPINE 2 VIEWS COMPARISON:  May 26, 2016 FINDINGS: Rods are identified in the thoracic and lumbar spine. Scoliotic curvature persists, unchanged. No evidence of hardware failure is identified. No acute fractures are seen. Degenerative changes are seen in the cervical spine. No other acute abnormalities are noted. IMPRESSION: Postsurgical changes to the thoracic spine. Degenerative changes in the cervical spine. No other acute abnormalities. Electronically Signed   By: Dorise Bullion III M.D   On: 07/21/2017 10:45    Procedures Procedures (including critical care time)  Medications Ordered in ED Medications - No data to display   Initial Impression / Assessment and Plan / ED Course  I have reviewed the triage vital signs and the nursing notes.  Pertinent labs & imaging results that were available during my care of the patient were reviewed by me and considered in my medical decision making (see chart for details).     Patient presents to ED for evaluation of mid upper back pain for the past 3 days. She does have a history of scoliosis and arthritis. She has chronic back pain but has worsened in the past 3 days. On physical exam there is midline spinal tenderness in thoracic region as well as bilateral trapezius musculature. She is afebrile with no history of fever. She has no IV drug use,  numbness, weakness, falls or injuries. Sensation is intact to light touch in bilateral lower extremities. She did have back surgery when she was a child for scoliosis. She is ambulatory here in the ED. She has no neck stiffness or tenderness to palpation. I have low suspicion for cauda equina or other acute spinal cord injury being the cause of her back pain. X-ray of the thoracic spine returned as negative for acute changes. Will give patient a muscle relaxer and advise her to continue anti-inflammatories. Advised to follow-up with PCP for further evaluation. Patient appears stable for discharge at this time. Strict return precautions given.  Final Clinical Impressions(s) / ED Diagnoses   Final diagnoses:  Chronic bilateral thoracic back pain    New Prescriptions New Prescriptions   METHOCARBAMOL (ROBAXIN) 500 MG TABLET    Take 1 tablet (500 mg total) by mouth 2 (two)  times daily.     Delia Heady, PA-C 07/21/17 1102    Duffy Bruce, MD 07/22/17 [phone]

## 2017-08-15 ENCOUNTER — Other Ambulatory Visit: Payer: Self-pay | Admitting: Family Medicine

## 2017-08-15 NOTE — Telephone Encounter (Signed)
ok 

## 2017-08-15 NOTE — Telephone Encounter (Signed)
Pt was seen back in August in ER for back pain- is this okay to refill?

## 2017-08-22 ENCOUNTER — Ambulatory Visit (INDEPENDENT_AMBULATORY_CARE_PROVIDER_SITE_OTHER): Payer: Medicare HMO | Admitting: Family Medicine

## 2017-08-22 ENCOUNTER — Encounter: Payer: Self-pay | Admitting: Family Medicine

## 2017-08-22 VITALS — BP 120/70 | HR 92 | Temp 97.9°F | Wt 125.4 lb

## 2017-08-22 DIAGNOSIS — R946 Abnormal results of thyroid function studies: Secondary | ICD-10-CM | POA: Diagnosis not present

## 2017-08-22 DIAGNOSIS — R202 Paresthesia of skin: Secondary | ICD-10-CM | POA: Diagnosis not present

## 2017-08-22 NOTE — Patient Instructions (Signed)
Paresthesia Paresthesia is a burning or prickling feeling. This feeling can happen in any part of the body. It often happens in the hands, arms, legs, or feet. Usually, it is not painful. In most cases, the feeling goes away in a short time and is not a sign of a serious problem. Follow these instructions at home:  Avoid drinking alcohol.  Try massage or needle therapy (acupuncture) to help with your problems.  Keep all follow-up visits as told by your doctor. This is important. Contact a doctor if:  You keep on having episodes of paresthesia.  Your burning or prickling feeling gets worse when you walk.  You have pain or cramps.  You feel dizzy.  You have a rash. Get help right away if:  You feel weak.  You have trouble walking or moving.  You have problems speaking, understanding, or seeing.  You feel confused.  You cannot control when you pee (urinate) or poop (bowel movement).  You lose feeling (numbness) after an injury.  You pass out (faint). This information is not intended to replace advice given to you by your health care provider. Make sure you discuss any questions you have with your health care provider. Document Released: 10/26/2008 Document Revised: 04/20/2016 Document Reviewed: 11/09/2014 Elsevier Interactive Patient Education  2018 [employer] Inc[employer]  

## 2017-08-22 NOTE — Progress Notes (Signed)
   Subjective:    Patient ID: Robin Richards, female    DOB: 08-06-1976, 41 y.o.   MRN: [id number]  HPI Chief Complaint  Patient presents with  . whole right side    whole right side of body is hurting, tingling. started about a week ago   Complains of a 1 week history of right sided paresthesias. States she feels weaker on her right side as well, upper and lower extremities. States symptoms started acutely, no known injury. No history of similar symptoms. Reports right shoulder and arm pain along with numbness and tingling down to her fingers. States her right hip is painful with certain movement and she is having numbness and tingling down to her mid shin on the right leg.   States she has taken Ibuprofen without any relief.   Denies fever, chills, rash, weight loss, dizziness, vision changes, confusion, neck pain, chest pain, palpitations, shortness of breath, abdominal pain, N/V/D, urinary symptoms, LE edema.   Denies alcohol, drug use. No new medications.   Reviewed allergies, medications, past medical, surgical, and social history.   Review of Systems Pertinent positives and negatives in the history of present illness.     Objective:   Physical Exam  Constitutional: She is oriented to person, place, and time. She appears well-developed and well-nourished. No distress.  HENT:  Mouth/Throat: Oropharynx is clear and moist.  Eyes: Pupils are equal, round, and reactive to light. Conjunctivae and EOM are normal.  Neck: Normal range of motion. Neck supple.  Cardiovascular: Normal rate, regular rhythm, normal heart sounds and intact distal pulses.   Pulmonary/Chest: Effort normal and breath sounds normal.  Musculoskeletal: Normal range of motion.       Right shoulder: She exhibits tenderness and pain.  Right shoulder without erythema, edema or laxity. Tenderness to anterior and lateral aspects. Negative Neers and Hawkins.  Right hip exam normal.    Lymphadenopathy:    She has no  cervical adenopathy.  Neurological: She is alert and oriented to person, place, and time. She has normal strength and normal reflexes. No cranial nerve deficit or sensory deficit. She displays a negative Romberg sign. Coordination and gait normal.  No facial asymmetry, no pronator drift, normal finger to nose.   Skin: Skin is warm and dry. No rash noted.  Psychiatric: She has a normal mood and affect. Her speech is normal and behavior is normal. Thought content normal.   BP 120/70   Pulse 92   Temp 97.9 F (36.6 C) (Oral)   Wt 125 lb 6.4 oz (56.9 kg)   BMI 22.94 kg/m       Assessment & Plan:  Paresthesia of right upper and lower extremity - Plan: CBC with Differential/Platelet, Comprehensive metabolic panel, TSH, RPR, Vitamin B12, HIV antibody, Sedimentation rate, ANA, Rheumatoid factor  She has subjective weakness but exam is unremarkable. Intact sensory and motor function. Plan to check labs and consider referral to neurology.

## 2017-08-23 LAB — CBC WITH DIFFERENTIAL/PLATELET
BASOS ABS: 45 {cells}/uL (ref 0–200)
Basophils Relative: 0.5 %
EOS ABS: 383 {cells}/uL (ref 15–500)
EOS PCT: 4.3 %
HCT: 39.2 % (ref 35.0–45.0)
Hemoglobin: 12.4 g/dL (ref 11.7–15.5)
Lymphs Abs: 2812 cells/uL (ref 850–3900)
MCH: 23 pg — ABNORMAL LOW (ref 27.0–33.0)
MCHC: 31.6 g/dL — AB (ref 32.0–36.0)
MCV: 72.7 fL — ABNORMAL LOW (ref 80.0–100.0)
MONOS PCT: 7.8 %
MPV: 11.2 fL (ref 7.5–12.5)
Neutro Abs: 4966 cells/uL (ref 1500–7800)
Neutrophils Relative %: 55.8 %
Platelets: 291 10*3/uL (ref 140–400)
RBC: 5.39 10*6/uL — ABNORMAL HIGH (ref 3.80–5.10)
RDW: 13.3 % (ref 11.0–15.0)
TOTAL LYMPHOCYTE: 31.6 %
WBC mixed population: 694 cells/uL (ref 200–950)
WBC: 8.9 10*3/uL (ref 3.8–10.8)

## 2017-08-23 LAB — COMPREHENSIVE METABOLIC PANEL
AG Ratio: 1.7 (calc) (ref 1.0–2.5)
ALKALINE PHOSPHATASE (APISO): 100 U/L (ref 33–115)
ALT: 10 U/L (ref 6–29)
AST: 14 U/L (ref 10–30)
Albumin: 3.9 g/dL (ref 3.6–5.1)
BILIRUBIN TOTAL: 0.4 mg/dL (ref 0.2–1.2)
BUN: 8 mg/dL (ref 7–25)
CALCIUM: 9.5 mg/dL (ref 8.6–10.2)
CHLORIDE: 107 mmol/L (ref 98–110)
CO2: 27 mmol/L (ref 20–32)
Creat: 0.98 mg/dL (ref 0.50–1.10)
GLOBULIN: 2.3 g/dL (ref 1.9–3.7)
Glucose, Bld: 102 mg/dL — ABNORMAL HIGH (ref 65–99)
Potassium: 3.8 mmol/L (ref 3.5–5.3)
Sodium: 140 mmol/L (ref 135–146)
Total Protein: 6.2 g/dL (ref 6.1–8.1)

## 2017-08-23 LAB — TEST AUTHORIZATION

## 2017-08-23 LAB — HIV ANTIBODY (ROUTINE TESTING W REFLEX): HIV 1&2 Ab, 4th Generation: NONREACTIVE

## 2017-08-23 LAB — RPR: RPR: NONREACTIVE

## 2017-08-23 LAB — RHEUMATOID FACTOR: Rhuematoid fact SerPl-aCnc: <14 IU/mL (ref <14)

## 2017-08-23 LAB — T4, FREE: Free T4: 1.1 ng/dL (ref 0.8–1.8)

## 2017-08-23 LAB — TSH: TSH: 0.07 m[IU]/L — AB

## 2017-08-23 LAB — VITAMIN B12: VITAMIN B 12: 280 pg/mL (ref 200–1100)

## 2017-08-23 LAB — SEDIMENTATION RATE: SED RATE: 2 mm/h (ref 0–20)

## 2017-08-23 LAB — ANA: Anti Nuclear Antibody(ANA): NEGATIVE

## 2017-08-23 LAB — T3: T3, Total: 75 ng/dL — ABNORMAL LOW (ref 76–181)

## 2017-09-04 ENCOUNTER — Telehealth: Payer: Self-pay | Admitting: Family Medicine

## 2017-09-04 NOTE — Telephone Encounter (Signed)
error 

## 2017-09-05 ENCOUNTER — Telehealth: Payer: Self-pay | Admitting: Internal Medicine

## 2017-09-05 ENCOUNTER — Encounter: Payer: Self-pay | Admitting: Neurology

## 2017-09-05 DIAGNOSIS — R29898 Other symptoms and signs involving the musculoskeletal system: Secondary | ICD-10-CM

## 2017-09-05 DIAGNOSIS — M79606 Pain in leg, unspecified: Principal | ICD-10-CM

## 2017-09-05 DIAGNOSIS — M79603 Pain in arm, unspecified: Secondary | ICD-10-CM

## 2017-09-05 DIAGNOSIS — R200 Anesthesia of skin: Secondary | ICD-10-CM

## 2017-09-05 NOTE — Telephone Encounter (Signed)
Please refer her to neurology for right upper and lower extremity numbness and bilateral upper extremity weakness. Let her know that we are referring her for further evaluation.  Find out if she has seen an orthopedist in the past. We do not need to refer to ortho at this time. Let's have her see the neurologist first.

## 2017-09-05 NOTE — Telephone Encounter (Signed)
I have put in referral to neurology for pt and pt has been aware. Pt has never seen ortho in the past that she knows of but will keep it in mind for the future incase we have refer later

## 2017-09-05 NOTE — Telephone Encounter (Signed)
Pt called and states that she is having weakness on both sides now. Her pain level is a 6 out of 10. Numbness only on right side like before but no tingling. No fever, rash, chills, SOB, chest pain. She wants to know if she is suppose to be referral to neurology.

## 2017-09-12 ENCOUNTER — Encounter: Payer: Self-pay | Admitting: Family Medicine

## 2017-09-12 ENCOUNTER — Ambulatory Visit
Admission: RE | Admit: 2017-09-12 | Discharge: 2017-09-12 | Disposition: A | Discharge status: Home / Self Care | Payer: Medicare HMO | Source: Ambulatory Visit | Attending: Family Medicine | Admitting: Family Medicine

## 2017-09-12 ENCOUNTER — Ambulatory Visit (INDEPENDENT_AMBULATORY_CARE_PROVIDER_SITE_OTHER): Payer: Medicare HMO | Admitting: Family Medicine

## 2017-09-12 VITALS — BP 110/70 | HR 72 | Temp 98.1°F | Wt 126.4 lb

## 2017-09-12 DIAGNOSIS — M419 Scoliosis, unspecified: Secondary | ICD-10-CM

## 2017-09-12 DIAGNOSIS — M25511 Pain in right shoulder: Secondary | ICD-10-CM | POA: Diagnosis not present

## 2017-09-12 DIAGNOSIS — M1990 Unspecified osteoarthritis, unspecified site: Secondary | ICD-10-CM | POA: Insufficient documentation

## 2017-09-12 MED ORDER — CYCLOBENZAPRINE HCL 10 MG PO TABS: 10.0000 mg | ORAL_TABLET | Freq: Three times a day (TID) | ORAL | 0 refills | Status: DC | PRN

## 2017-09-12 MED ORDER — NAPROXEN SODIUM 220 MG PO TABS: 220.0000 mg | ORAL_TABLET | Freq: Two times a day (BID) | ORAL | 0 refills | Status: DC

## 2017-09-12 NOTE — Patient Instructions (Addendum)
Stop the ibuprofen. Start taking 2 Aleve twice daily with food.  You can take the flexeril (muscle relaxant) at bedtime if needed for severe pain but this may make you sleepy so do not drink alcohol or drive with this medication.   We will call you with the XR results.

## 2017-09-12 NOTE — Progress Notes (Signed)
Subjective:    Patient ID: Robin Richards, female    DOB: 1976-04-10, 41 y.o.   MRN: [id number]  HPI Chief Complaint  Patient presents with  . severe pain    severe pain in shoulder pain, started last night.    She is a 41 year old patient with a past medical history of scoliosis, chronic back pain and arthritis who is here today with complaints of severe right shoulder pain that started last night. States pain is worse with movement and radiates into her right lateral neck down her upper right trapezius. Denies injury. History of right shoulder pain in 2016 and she went to PT. States symptoms resolved. History of broken scapula as a child per patient.  Scoliosis required surgery when she was age 29. She has a rod in her back.     States she took ibuprofen 600 mg last night and this morning without any relief. Has not tried ice or heat.   No longer having numbness, tingling, or weakness.   States she does not have an orthopedist.    Denies fever, chills, N/V/D.  No history of cancer.   XR thoracic spine results as documented below.  IMPRESSION: Postsurgical changes to the thoracic spine. Degenerative changes in the cervical spine. No other acute abnormalities.  Electronically Signed   By: Dorise Bullion III M.D   On: 07/21/2017 10:45  Reviewed allergies, medications, past medical, surgical, family, and social history.    Review of Systems Pertinent positives and negatives in the history of present illness.     Objective:   Physical Exam  Constitutional: She is oriented to person, place, and time. She appears well-developed and well-nourished. No distress.  Neck: Normal range of motion. Neck supple.  Cardiovascular: Normal rate and regular rhythm.   Pulmonary/Chest: Effort normal and breath sounds normal.  Musculoskeletal:       Right shoulder: She exhibits decreased range of motion, tenderness, bony tenderness, crepitus and pain. She exhibits normal strength.   Cervical back: She exhibits pain.  Pain in right trapezius with flexion and left rotation of neck. Right shoulder without erythema, bruising, edema. Limited ROM due to pain.  Negative empty can test.  Normal grip strength.  Positive Neers and Hawkins.   Bilateral upper extremities are neurovascularly intact.     Neurological: She is alert and oriented to person, place, and time. She has normal strength. No cranial nerve deficit or sensory deficit.  Skin: Skin is warm and dry. No rash noted. No pallor.  Psychiatric: She has a normal mood and affect. Her speech is normal and behavior is normal. Thought content normal.   BP 110/70   Pulse 72   Temp 98.1 F (36.7 C) (Oral)   Wt 126 lb 6.4 oz (57.3 kg)   BMI 23.12 kg/m       Assessment & Plan:  Acute pain of right shoulder - Plan: DG Shoulder Right, naproxen sodium (ALEVE) 220 MG tablet, cyclobenzaprine (FLEXERIL) 10 MG tablet  Scoliosis, unspecified scoliosis type, unspecified spinal region  Arthritis  Plan to send her for an XR. She will stop ibuprofen and start taking 2 Aleve twice daily with food. Samples of Aleve given #18.  She may take the muscle relaxant if needed for severe pain. Discussed possible side effects and precautions with this medication. She may also use heat.  Follow up pending XR. Discussed options such as having her return for possible injection by Dr. Redmond School or sending her for PT vs referral  to an orthopedist.

## 2017-09-17 ENCOUNTER — Telehealth: Payer: Self-pay | Admitting: Internal Medicine

## 2017-09-17 DIAGNOSIS — M25511 Pain in right shoulder: Principal | ICD-10-CM

## 2017-09-17 DIAGNOSIS — M542 Cervicalgia: Secondary | ICD-10-CM

## 2017-09-17 DIAGNOSIS — G8929 Other chronic pain: Secondary | ICD-10-CM

## 2017-09-17 NOTE — Telephone Encounter (Signed)
If she prefers to see an orthopedist then that is fine. Chronic right shoulder and neck pain.

## 2017-09-17 NOTE — Telephone Encounter (Signed)
Pt called and wanted to know what her xrays were. I gave them to her but she is wanting to go to ortho as she thinks its Arthritis. Is it okay to refer her to ortho instead of PT. If so whats the dx- shoulder pain?

## 2017-09-17 NOTE — Telephone Encounter (Signed)
I have put in the order for ortho

## 2017-09-21 ENCOUNTER — Encounter (INDEPENDENT_AMBULATORY_CARE_PROVIDER_SITE_OTHER): Payer: Self-pay | Admitting: Orthopedic Surgery

## 2017-09-21 ENCOUNTER — Ambulatory Visit (INDEPENDENT_AMBULATORY_CARE_PROVIDER_SITE_OTHER): Payer: Medicare HMO

## 2017-09-21 ENCOUNTER — Ambulatory Visit (INDEPENDENT_AMBULATORY_CARE_PROVIDER_SITE_OTHER): Payer: Medicare HMO | Admitting: Orthopedic Surgery

## 2017-09-21 DIAGNOSIS — M5412 Radiculopathy, cervical region: Secondary | ICD-10-CM | POA: Diagnosis not present

## 2017-09-21 MED ORDER — METHYLPREDNISOLONE 4 MG PO TABS
ORAL_TABLET | ORAL | 0 refills | Status: DC
Start: 2017-09-21 — End: 2018-01-02

## 2017-09-21 NOTE — Progress Notes (Signed)
Office Visit Note   Patient: Robin Richards           Date of Birth: 08-25-1976           MRN: [id number] Visit Date: 09/21/2017 Requested by: Girtha Rm, NP-C [place], [place] [address] PCP: Girtha Rm, NP-C  Subjective: Chief Complaint  Patient presents with  . Right Shoulder - Pain    HPI: Robin Richards is a 41 year old patient with right shoulder pain.  She states that her whole arm hurts and is been hurting for the last 2-3 weeks.  Denies any injury.  She also reports neck pain in association with this.  The pain will wake her from sleep at night.  She is taking a muscle relaxer.  She is not employed.  She denies any left-sided symptoms.  Does have a history of scoliosis surgery in the past.              ROS: All systems reviewed are negative as they relate to the chief complaint within the history of present illness.  Patient denies  fevers or chills.   Assessment & Plan: Visit Diagnoses:  1. Radiculopathy, cervical region     Plan: Impression is right shoulder pain with fairly symmetric exam between right and left shoulder and no rotator cuff weakness.  Her symptoms seem to be more radicular in nature.  For that reason I would favor cervical radiculopathy as the initial diagnosis.  Continue muscle relaxers and I will add Medrol Dosepak.  If she is no better in 6 weeks come back for further imaging.  Follow-Up Instructions: Return if symptoms worsen or fail to improve.   Orders:  Orders Placed This Encounter  Procedures  . XR Cervical Spine 2 or 3 views   No orders of the defined types were placed in this encounter.     Procedures: No procedures performed   Clinical Data: No additional findings.  Objective: Vital Signs: LMP 09/10/2017   Physical Exam:   Constitutional: Patient appears well-developed HEENT:  Head: Normocephalic Eyes:EOM are normal Neck: Normal range of motion Cardiovascular: Normal rate Pulmonary/chest: Effort  normal Neurologic: Patient is alert Skin: Skin is warm Psychiatric: Patient has normal mood and affect Orthopedic exam demonstrates pretty good cervical spine range of motion 5 out of 5 grip EPL FPL interosseous wrist flexion wrist extension biceps triceps and deltoid strength.  Ortho Exam: Rotator cuff strength is pretty good to infraspinatus supraspinatus and subscap muscle testing right and left hand side.  A little bit of coarseness on both sides with internal and external rotation at 90 degrees of abduction.  No masses lymphadenopathy or skin changes noted in the shoulder girdle region.  Negative apprehension relocation testing bilaterally.  Reflexes symmetric.  No muscle atrophy.  Specialty Comments:  No specialty comments available.  Imaging: Xr Cervical Spine 2 Or 3 Views  Result Date: 09/21/2017 AP lateral cervical spine reviewed.  Hardware from previous thoracic spine scoliosis surgery noted.  There is some kyphosis of the cervical spine with degenerative changes noted at C4-5 and C5-6 with anterior spurring in this area.  Facet arthritis is minimal.    PMFS History: Patient Active Problem List   Diagnosis Date Noted  . Scoliosis   . Arthritis   . Seasonal allergies 02/23/2016  . Asthma, moderate persistent 02/23/2016  . Smoker 02/23/2016  . Prediabetes 04/09/2015  . Well woman exam 04/09/2015   Past Medical History:  Diagnosis Date  . Arthritis   .  Asthma   . Elevated hemoglobin A1c   . Heartburn   . Scoliosis     Family History  Problem Relation Age of Onset  . Cirrhosis Mother   . Cancer Father   . Hypertension Sister   . Thyroid disease Sister   . Breast cancer Maternal Grandmother     Past Surgical History:  Procedure Laterality Date  . SPINE SURGERY     Social History   Occupational History  . Not on file.   Social History Main Topics  . Smoking status: Former Smoker    Packs/day: 0.50    Years: 20.00    Types: Cigarettes    Quit date:  02/17/2017  . Smokeless tobacco: Never Used  . Alcohol use No  . Drug use: No  . Sexual activity: Not Currently    Partners: Female

## 2017-09-21 NOTE — Addendum Note (Signed)
Addended by: Laurann Montana on: 09/21/2017 12:42 PM   Modules accepted: Orders

## 2017-12-07 ENCOUNTER — Ambulatory Visit: Payer: Self-pay | Admitting: Neurology

## 2017-12-12 ENCOUNTER — Other Ambulatory Visit: Payer: Self-pay | Admitting: Family Medicine

## 2017-12-12 NOTE — Telephone Encounter (Signed)
Is this okay to refill? 

## 2017-12-18 ENCOUNTER — Other Ambulatory Visit: Payer: Self-pay

## 2017-12-18 ENCOUNTER — Emergency Department (HOSPITAL_COMMUNITY): Payer: Medicare HMO

## 2017-12-18 ENCOUNTER — Emergency Department (HOSPITAL_COMMUNITY)
Admission: EM | Admit: 2017-12-18 | Discharge: 2017-12-18 | Disposition: A | Discharge status: Home / Self Care | Payer: Medicare HMO | Attending: Emergency Medicine | Admitting: Emergency Medicine

## 2017-12-18 ENCOUNTER — Encounter (HOSPITAL_COMMUNITY): Payer: Self-pay | Admitting: Emergency Medicine

## 2017-12-18 DIAGNOSIS — Y9384 Activity, sleeping: Secondary | ICD-10-CM | POA: Insufficient documentation

## 2017-12-18 DIAGNOSIS — Y92003 Bedroom of unspecified non-institutional (private) residence as the place of occurrence of the external cause: Secondary | ICD-10-CM | POA: Insufficient documentation

## 2017-12-18 DIAGNOSIS — Y998 Other external cause status: Secondary | ICD-10-CM | POA: Diagnosis not present

## 2017-12-18 DIAGNOSIS — S199XXA Unspecified injury of neck, initial encounter: Secondary | ICD-10-CM | POA: Diagnosis present

## 2017-12-18 DIAGNOSIS — M542 Cervicalgia: Secondary | ICD-10-CM | POA: Diagnosis not present

## 2017-12-18 DIAGNOSIS — J45909 Unspecified asthma, uncomplicated: Secondary | ICD-10-CM | POA: Diagnosis not present

## 2017-12-18 DIAGNOSIS — Z79899 Other long term (current) drug therapy: Secondary | ICD-10-CM | POA: Diagnosis not present

## 2017-12-18 DIAGNOSIS — Z87891 Personal history of nicotine dependence: Secondary | ICD-10-CM | POA: Diagnosis not present

## 2017-12-18 DIAGNOSIS — Y33XXXA Other specified events, undetermined intent, initial encounter: Secondary | ICD-10-CM | POA: Diagnosis not present

## 2017-12-18 DIAGNOSIS — S161XXA Strain of muscle, fascia and tendon at neck level, initial encounter: Secondary | ICD-10-CM | POA: Diagnosis not present

## 2017-12-18 MED ORDER — IBUPROFEN 800 MG PO TABS: 800.0000 mg | ORAL_TABLET | Freq: Three times a day (TID) | ORAL | 0 refills | Status: DC | PRN

## 2017-12-18 MED ORDER — TRAMADOL HCL 50 MG PO TABS: 50.0000 mg | ORAL_TABLET | Freq: Four times a day (QID) | ORAL | 0 refills | Status: DC | PRN

## 2017-12-18 NOTE — ED Provider Notes (Signed)
[facility] EMERGENCY DEPARTMENT Provider Note   CSN: [id number] Arrival date & time: 12/18/17  7829     History   Chief Complaint Chief Complaint  Patient presents with  . Neck Pain    HPI Robin Richards is a 42 y.o. female.  HPI Patient presents to the emergency department with left-sided neck pain that started 4 days ago.  The patient states she woke up from sleeping with left-sided neck discomfort.  She states that the pain is worse with certain movements and palpation patient states that she did not take any medications prior to arrival.  Patient states that she does not have any numbness, weakness, dizziness, headache, blurred vision, chest pain, shortness of breath or syncope.  The patient says the pain radiates into her left shoulder. Past Medical History:  Diagnosis Date  . Arthritis   . Asthma   . Elevated hemoglobin A1c   . Heartburn   . Scoliosis     Patient Active Problem List   Diagnosis Date Noted  . Scoliosis   . Arthritis   . Seasonal allergies 02/23/2016  . Asthma, moderate persistent 02/23/2016  . Smoker 02/23/2016  . Prediabetes 04/09/2015  . Well woman exam 04/09/2015    Past Surgical History:  Procedure Laterality Date  . SPINE SURGERY      OB History    No data available       Home Medications    Prior to Admission medications   Medication Sig Start Date End Date Taking? Authorizing Provider  albuterol (PROVENTIL HFA;VENTOLIN HFA) 108 (90 Base) MCG/ACT inhaler Inhale 1-2 puffs into the lungs every 6 (six) hours as needed for wheezing or shortness of breath. 12/06/16  Yes Henson, Vickie L, NP-C  cyclobenzaprine (FLEXERIL) 10 MG tablet Take 1 tablet (10 mg total) by mouth 3 (three) times daily as needed for muscle spasms. 09/12/17  Yes Henson, Vickie L, NP-C  fluticasone (FLONASE) 50 MCG/ACT nasal spray Place 2 sprays into both nostrils daily. 12/06/16  Yes Henson, Vickie L, NP-C  fluticasone furoate-vilanterol (BREO  ELLIPTA) 200-25 MCG/INH AEPB Inhale 1 puff into the lungs daily. 12/06/16  Yes Henson, Vickie L, NP-C  ibuprofen (ADVIL,MOTRIN) 600 MG tablet Take 1 TABLET BY MOUTH EVERY 6 HOURS AS NEEDED 12/12/17  Yes Denita Lung, MD  naproxen sodium (ALEVE) 220 MG tablet Take 1 tablet (220 mg total) by mouth 2 (two) times daily with a meal. 09/12/17  Yes Henson, Vickie L, NP-C  methylPREDNISolone (MEDROL) 4 MG tablet TAKE DOSE PAK AS DIRECTED Patient not taking: Reported on 12/18/2017 09/21/17   Meredith Pel, MD    Family History Family History  Problem Relation Age of Onset  . Cirrhosis Mother   . Cancer Father   . Hypertension Sister   . Thyroid disease Sister   . Breast cancer Maternal Grandmother     Social History Social History   Tobacco Use  . Smoking status: Former Smoker    Packs/day: 0.50    Years: 20.00    Pack years: 10.00    Types: Cigarettes    Last attempt to quit: 02/17/2017    Years since quitting: 0.8  . Smokeless tobacco: Never Used  Substance Use Topics  . Alcohol use: No    Alcohol/week: 0.0 oz  . Drug use: No     Allergies   Patient has no known allergies.   Review of Systems Review of Systems  All other systems negative except as documented in the HPI.  All pertinent positives and negatives as reviewed in the HPI. Physical Exam Updated Vital Signs BP 108/76   Pulse 71   Temp 98.6 F (37 C) (Oral)   Resp 17   Ht 5' 2"  (1.575 m)   LMP 11/27/2017 (Exact Date)   SpO2 100%   BMI 23.12 kg/m   Physical Exam  Constitutional: She is oriented to person, place, and time. She appears well-developed and well-nourished. No distress.  HENT:  Head: Normocephalic and atraumatic.  Eyes: Pupils are equal, round, and reactive to light.  Pulmonary/Chest: Effort normal.  Musculoskeletal:       Cervical back: She exhibits tenderness and pain. She exhibits normal range of motion, no bony tenderness, no swelling, no edema and no spasm.        Back:  Neurological: She is alert and oriented to person, place, and time.  Skin: Skin is warm and dry.  Psychiatric: She has a normal mood and affect.  Nursing note and vitals reviewed.    ED Treatments / Results  Labs (all labs ordered are listed, but only abnormal results are displayed) Labs Reviewed - No data to display  EKG  EKG Interpretation None       Radiology Dg Cervical Spine Complete  Result Date: 12/18/2017 CLINICAL DATA:  42 year old female with neck pain, radicular symptoms EXAM: CERVICAL SPINE - COMPLETE 4+ VIEW COMPARISON:  Prior cervical spine radiographs 09/21/2017 FINDINGS: No evidence of fracture or malalignment. Mild reversal of the normal cervical lordosis. Degenerative disc disease most significant at C5-C6. No evidence of foraminal stenosis. Incompletely imaged scoliosis stabilization hardware. IMPRESSION: 1. Mild multilevel degenerative disc disease most significant at C5-C6. 2. Reversal of the normal cervical lordosis may be positional or related to muscle spasm. 3. No evidence of fracture, malalignment or foraminal stenosis. Electronically Signed   By: Jacqulynn Cadet M.D.   On: 12/18/2017 09:44    Procedures Procedures (including critical care time)  Medications Ordered in ED Medications - No data to display   Initial Impression / Assessment and Plan / ED Course  I have reviewed the triage vital signs and the nursing notes.  Pertinent labs & imaging results that were available during my care of the patient were reviewed by me and considered in my medical decision making (see chart for details).     Patient has no neurological deficits noted on exam.  Patient will be treated for cervical and trapezius muscle strain.  Patient is tenderness follows the muscle body.  Patient is advised the plan and all questions were answered.  I did advise her to follow-up with her primary care doctor.  Final Clinical Impressions(s) / ED Diagnoses   Final  diagnoses:  None    ED Discharge Orders    None       Rebeca Allegra 12/18/17 [place], MD 12/18/17 2241

## 2017-12-18 NOTE — ED Triage Notes (Signed)
Pt reports L sided neck pain starting four days ago when she woke from sleep. States she thinks she slept on it wrong.

## 2017-12-18 NOTE — Discharge Instructions (Signed)
Return here as needed.  Follow-up with your primary doctor. use ice and heat on your neck.  Your x-rays showed some arthritis in your neck but no other abnormalities.

## 2018-01-02 ENCOUNTER — Other Ambulatory Visit: Payer: Self-pay | Admitting: Family Medicine

## 2018-01-02 ENCOUNTER — Encounter: Payer: Self-pay | Admitting: Family Medicine

## 2018-01-02 ENCOUNTER — Ambulatory Visit (INDEPENDENT_AMBULATORY_CARE_PROVIDER_SITE_OTHER): Payer: Medicare HMO | Admitting: Family Medicine

## 2018-01-02 VITALS — BP 120/76 | HR 86 | Temp 97.7°F | Wt 141.0 lb

## 2018-01-02 DIAGNOSIS — M542 Cervicalgia: Secondary | ICD-10-CM

## 2018-01-02 DIAGNOSIS — M5090 Cervical disc disorder, unspecified, unspecified cervical region: Secondary | ICD-10-CM | POA: Diagnosis not present

## 2018-01-02 MED ORDER — METHYLPREDNISOLONE 4 MG PO TABS: ORAL_TABLET | ORAL | 0 refills | Status: DC

## 2018-01-02 MED ORDER — DICLOFENAC SODIUM 75 MG PO TBEC: 75.0000 mg | DELAYED_RELEASE_TABLET | Freq: Two times a day (BID) | ORAL | 0 refills | Status: DC

## 2018-01-02 NOTE — Patient Instructions (Addendum)
Take the steroid as prescribed. Start the diclofenac. Stop Aleve, ibuprofen, advil or any other over the counter pain medication. Use heat or ice, the topical pain medicine you have at home and do gentle stretches.  You can stop the muscle relaxant and Tramadol since this has not helped your symptoms.   If your symptoms get worse or are not improving then let me know.   Our next step will be to try physical therapy or possibly have you see Dr. Marlou Sa or someone in his orthopedic practice for this.

## 2018-01-02 NOTE — Progress Notes (Signed)
   Subjective:    Patient ID: Robin Richards, female    DOB: [birth year], 42 y.o.   MRN: [id number]  HPI Chief Complaint  Patient presents with  . neck pain    neck pain, nothing is helping the pain   She is here with complaints of a 2 week history of left lateral neck pain. Denies injury. She was seen an evaluated in the ED for this last week.   States she has tried heat, ice, topical analgesic, ibuprofen, Tramadol, and a muscle relaxant without any relief.   Denies numbness, tingling or weakness. No fever, chills, chest pain, shortness of breath, abdominal pain, N/V/D.   Reviewed allergies, medications, past medical, surgical, family, and social history.    Review of Systems Pertinent positives and negatives in the history of present illness.     Objective:   Physical Exam  Constitutional: She is oriented to person, place, and time. She appears well-developed and well-nourished. No distress.  HENT:  Mouth/Throat: Oropharynx is clear and moist.  Neck: Neck supple.  Musculoskeletal:       Cervical back: She exhibits decreased range of motion, tenderness and pain.       Back:  Pain with extension, right neck rotation. TTP left upper trapezius. LUE with sensation and motor intact.   Neurological: She is alert and oriented to person, place, and time. She has normal strength. No cranial nerve deficit or sensory deficit.  Skin: Skin is warm and dry. No pallor.   BP 120/76   Pulse 86   Temp 97.7 F (36.5 C) (Oral)   Wt 141 lb (64 kg)   BMI 25.79 kg/m       Assessment & Plan:  Neck pain on left side - Plan: methylPREDNISolone (MEDROL) 4 MG tablet, diclofenac (VOLTAREN) 75 MG EC tablet  Cervical disc disease  Reviewed ED notes and cervical XR results.  Conservative treatment has not helped symptoms. We discussed treatment options such as steroids, PT or referral back to orthopedist. She would like to try steroids for now. I will also have her try diclofenac. She will  continue with ice, heat, topical analgesic and stretches. She will let me know if not improving and we will move forward with PT or have her see her ortho.

## 2018-01-03 ENCOUNTER — Telehealth: Payer: Self-pay | Admitting: Medical

## 2018-01-03 MED ORDER — METHYLPREDNISOLONE 4 MG PO TABS: ORAL_TABLET | ORAL | 0 refills | Status: DC

## 2018-01-03 MED ORDER — FLUTICASONE FUROATE-VILANTEROL 200-25 MCG/INH IN AEPB: 1.0000 | INHALATION_SPRAY | Freq: Every day | RESPIRATORY_TRACT | 1 refills | Status: DC

## 2018-01-03 NOTE — Telephone Encounter (Signed)
Ok to refill 

## 2018-01-03 NOTE — Telephone Encounter (Signed)
This was done yesterday as a fax instead of being sent to pharmacy

## 2018-01-03 NOTE — Telephone Encounter (Signed)
Is this ok to refill?  

## 2018-01-03 NOTE — Telephone Encounter (Signed)
Recv'd fax request from [facility] mail order for Breo 200-25 mcg

## 2018-01-03 NOTE — Telephone Encounter (Signed)
I sent in a new prescription yesterday for her.

## 2018-01-03 NOTE — Telephone Encounter (Signed)
done

## 2018-01-07 ENCOUNTER — Telehealth: Payer: Self-pay | Admitting: Family Medicine

## 2018-01-07 MED ORDER — FLUTICASONE FUROATE-VILANTEROL 200-25 MCG/INH IN AEPB: 1.0000 | INHALATION_SPRAY | Freq: Every day | RESPIRATORY_TRACT | 0 refills | Status: DC

## 2018-01-07 NOTE — Telephone Encounter (Signed)
[employer] is requesting Breo Ellipta 200 MCG dose powers for inhalation

## 2018-01-07 NOTE — Telephone Encounter (Signed)
Ok to refill if this is what she has been taking.

## 2018-01-07 NOTE — Telephone Encounter (Signed)
[employer] is requesting Breo Ellipta 200 MCG-25 /dose powder for inhallation

## 2018-01-07 NOTE — Telephone Encounter (Signed)
Refilled med to [facility]

## 2018-01-24 ENCOUNTER — Telehealth: Payer: Self-pay

## 2018-01-24 ENCOUNTER — Other Ambulatory Visit: Payer: Self-pay

## 2018-01-24 MED ORDER — FLUTICASONE PROPIONATE 50 MCG/ACT NA SUSP: 2.0000 | Freq: Every day | NASAL | 2 refills | Status: DC

## 2018-01-24 MED ORDER — ALBUTEROL SULFATE HFA 108 (90 BASE) MCG/ACT IN AERS: 1.0000 | INHALATION_SPRAY | Freq: Four times a day (QID) | RESPIRATORY_TRACT | 2 refills | Status: DC | PRN

## 2018-01-24 NOTE — Telephone Encounter (Signed)
Ok thanks 

## 2018-01-24 NOTE — Telephone Encounter (Signed)
Pt pharmacy is requesting a refill for pt albuterol and fluticasone . Pt has schedule a MWV with you in April. Please advise if this is ok to fill. Thanks [employer]

## 2018-01-28 ENCOUNTER — Telehealth: Payer: Self-pay | Admitting: Family Medicine

## 2018-01-28 MED ORDER — FLUTICASONE PROPIONATE 50 MCG/ACT NA SUSP: 2.0000 | Freq: Every day | NASAL | 0 refills | Status: DC

## 2018-01-28 MED ORDER — ALBUTEROL SULFATE HFA 108 (90 BASE) MCG/ACT IN AERS: 1.0000 | INHALATION_SPRAY | Freq: Four times a day (QID) | RESPIRATORY_TRACT | 0 refills | Status: AC | PRN

## 2018-01-28 NOTE — Telephone Encounter (Signed)
New Message   *STAT* If patient is at the pharmacy, call can be transferred to refill team.   1. Which medications need to be refilled? (please list name of each medication and dose if known)  Albuterol Sulfate HFA 90 mcg/actuation aerosol Fluticasone 50 mcg/actuation nasal spray, suspension  2. Which pharmacy/location (including street and city if local pharmacy) is medication to be sent to? [facility]  3. Do they need a 30 day or 90 day supply?  90 day supply

## 2018-01-28 NOTE — Telephone Encounter (Signed)
done

## 2018-02-26 ENCOUNTER — Encounter: Payer: Self-pay | Admitting: Family Medicine

## 2018-02-26 ENCOUNTER — Ambulatory Visit (INDEPENDENT_AMBULATORY_CARE_PROVIDER_SITE_OTHER): Payer: Medicare HMO | Admitting: Family Medicine

## 2018-02-26 VITALS — BP 120/80 | HR 75 | Ht 63.0 in | Wt 146.4 lb

## 2018-02-26 DIAGNOSIS — Z0000 Encounter for general adult medical examination without abnormal findings: Secondary | ICD-10-CM | POA: Diagnosis not present

## 2018-02-26 DIAGNOSIS — Z113 Encounter for screening for infections with a predominantly sexual mode of transmission: Secondary | ICD-10-CM

## 2018-02-26 DIAGNOSIS — J4540 Moderate persistent asthma, uncomplicated: Secondary | ICD-10-CM

## 2018-02-26 DIAGNOSIS — E7800 Pure hypercholesterolemia, unspecified: Secondary | ICD-10-CM | POA: Diagnosis not present

## 2018-02-26 DIAGNOSIS — R7303 Prediabetes: Secondary | ICD-10-CM

## 2018-02-26 DIAGNOSIS — R7989 Other specified abnormal findings of blood chemistry: Secondary | ICD-10-CM | POA: Diagnosis not present

## 2018-02-26 DIAGNOSIS — J302 Other seasonal allergic rhinitis: Secondary | ICD-10-CM

## 2018-02-26 NOTE — Progress Notes (Signed)
Robin Richards is a 42 y.o. female who presents for annual wellness visit and follow-up on chronic medical conditions.  She has the following concerns:  Complains of hearing loss for several years but getting worse especially on the right. Reports having iIntermittent tinnitus. States she thinks a "tube" fell out of her right ear a few weeks ago. Denise pain or drainage.   States her asthma is well controlled and no recent flares. Is not needing her albuterol inhaler often. Using Robin Richards daily.   States she stopped smoking 4 months ago.   Denies any issues with allergies. Using Flonase.   Is not taking diclofenac or muscle relaxer. No joint or muscle aches. Denies having arthralgias or myalgias in the recent weeks.   Immunization History  Administered Date(s) Administered  . Pneumococcal Conjugate-13 04/19/2017   Last Pap smear: 03/2015 and negative for HPV. No history of abnormal pap smear  Last mammogram: 05/11/2017 Last colonoscopy: never   LMP: 02/21/2018 and periods are regular Dentist: appointment later today with Dr. Burt Richards Ophtho: years ago  Exercise: walking several days.   Other doctors caring for patient include: Pulmonologist- Dr. Vaughan Richards Orthopedist- Dr. Marlou Richards    Depression screen:  See questionnaire below.  Depression screen [facility] 2/9 02/26/2018 04/19/2017  Decreased Interest 0 0  Down, Depressed, Hopeless 0 0  PHQ - 2 Score 0 0    Fall Risk Screen: see questionnaire below. Fall Risk  02/26/2018 04/19/2017  Falls in the past year? No No    ADL screen:  See questionnaire below Functional Status Survey: Is the patient deaf or have difficulty hearing?: Yes Does the patient have difficulty seeing, even when wearing glasses/contacts?: No Does the patient have difficulty concentrating, remembering, or making decisions?: No Does the patient have difficulty walking or climbing stairs?: No Does the patient have difficulty dressing or bathing?: No Does the patient have  difficulty doing errands alone such as visiting a doctor's office or shopping?: No   End of Life Discussion:  Patient does not have a living will and medical power of attorney. Discussed this again this year. MOST form on file and reviewed. No changes.   Review of Systems Constitutional: -fever, -chills, -sweats, -unexpected weight change, -anorexia, -fatigue Allergy: -sneezing, -itching, -congestion Dermatology: denies changing moles, rash, lumps, new worrisome lesions ENT: -runny nose, -ear pain, -sore throat, -hoarseness, -sinus pain, -teeth pain, +tinnitus, +hearing loss, -epistaxis Cardiology:  -chest pain, -palpitations, -edema, -orthopnea, -paroxysmal nocturnal dyspnea Respiratory: -cough, -shortness of breath, -dyspnea on exertion, -wheezing, -hemoptysis Gastroenterology: -abdominal pain, -nausea, -vomiting, -diarrhea, -constipation, -blood in stool, -changes in bowel movement, -dysphagia Hematology: -bleeding or bruising problems Musculoskeletal: -arthralgias, -myalgias, -joint swelling, -back pain, -neck pain, -cramping, -gait changes Ophthalmology: -vision changes, -eye redness, -itching, -discharge Urology: -dysuria, -difficulty urinating, -hematuria, -urinary frequency, -urgency, incontinence Neurology: -headache, -weakness, -tingling, -numbness, -speech abnormality, -memory loss, -falls, -dizziness Psychology:  -depressed mood, -agitation, -sleep problems    PHYSICAL EXAM:  BP 120/80   Pulse 75   Ht 5' 3"  (1.6 m)   Wt 146 lb 6.4 oz (66.4 kg)   LMP 02/21/2018   BMI 25.93 kg/m   General Appearance: Alert, cooperative, no distress, appears older than stated age Head: Normocephalic, without obvious abnormality, atraumatic Eyes: PERRL, conjunctiva/corneas clear, EOM's intact Ears: Normal TM's and external ear canals Nose: Nares normal, mucosa normal, no drainage or sinus tenderness Throat: Lips, mucosa, and tongue normal; teeth and gums normal Neck: Supple, no  lymphadenopathy; thyroid: no enlargement/tenderness/nodules; no carotid bruit or JVD Back:  Spine nontender, curvature of spine with a long well healed scar from surgery, ROM normal, no CVA tenderness Lungs: Clear to auscultation bilaterally without wheezes, rales or ronchi; respirations unlabored Chest Wall: No tenderness or deformity Heart: Regular rate and rhythm, S1 and S2 normal, no murmur, rub or gallop Breast Exam: refused. Sent for mammogram Abdomen: Soft, non-tender, nondistended, normoactive bowel sounds, no masses, no hepatosplenomegaly Genitalia: refused. Pap smear up to date. l Extremities: No clubbing, cyanosis or edema Pulses: 2+ and symmetric all extremities Skin: Skin color, texture, turgor normal, no rashes or lesions. Numerous scars from rash in past to bilateral upper and lower extremities Lymph nodes: Cervical, supraclavicular, and axillary nodes normal Neurologic: CNII-XII intact, normal strength, sensation and gait; reflexes 2+ and symmetric throughout Psych: Normal mood, affect, hygiene and grooming.  ASSESSMENT/PLAN: Medicare annual wellness visit, subsequent  Moderate persistent asthma without complication - Plan: CBC with Differential/Platelet, Comprehensive metabolic panel  Seasonal allergies  Prediabetes - Plan: CBC with Differential/Platelet, Comprehensive metabolic panel, Lipid panel, Hemoglobin A1c  Screen for STD (sexually transmitted disease) - Plan: GC/Chlamydia Probe Amp, RPR, HIV antibody  Low TSH level - Plan: CBC with Differential/Platelet, Comprehensive metabolic panel, TSH, T4, free, T3  Elevated LDL cholesterol level - Plan: Lipid panel  She appears to be doing well and in good spirits.  She recently stopped smoking and is excited about this. Asthma appears to be well controlled.  No recent flares.  Good compliance with Breo History of prediabetes-diet controlled.  Will check hemoglobin A1c. Counseled on healthy lifestyle with healthy diet and  exercise. Slightly low TSH with normal appearing thyroid on exam.  Will recheck thyroid panel. Seasonal allergies are well controlled with Flonase. STD screening done per patient request She will get her mammogram when it is due. She has a dentist appointment later today. Follow-up pending labs   Discussed monthly self breast exams and yearly mammograms; at least 30 minutes of aerobic activity at least 5 days/week and weight-bearing exercise 2x/week; proper sunscreen use reviewed; healthy diet, including goals of calcium and vitamin D intake and alcohol recommendations (less than or equal to 1 drink/day) reviewed; regular seatbelt use; changing batteries in smoke detectors.  Immunization recommendations discussed.  Colonoscopy recommendations reviewed   Medicare Attestation I have personally reviewed: The patient's medical and social history Their use of alcohol, tobacco or illicit drugs Their current medications and supplements The patient's functional ability including ADLs,fall risks, home safety risks, cognitive, and hearing and visual impairment Diet and physical activities Evidence for depression or mood disorders  The patient's weight, height, and BMI have been recorded in the chart.  I have made referrals, counseling, and provided education to the patient based on review of the above and I have provided the patient with a written personalized care plan for preventive services.     Harland Dingwall, NP-C   02/26/2018

## 2018-02-27 LAB — CBC WITH DIFFERENTIAL/PLATELET
BASOS ABS: 0 10*3/uL (ref 0.0–0.2)
Basos: 0 %
EOS (ABSOLUTE): 0.4 10*3/uL (ref 0.0–0.4)
EOS: 5 %
Hematocrit: 41.9 % (ref 34.0–46.6)
Hemoglobin: 13.2 g/dL (ref 11.1–15.9)
IMMATURE GRANULOCYTES: 0 %
Immature Grans (Abs): 0 10*3/uL (ref 0.0–0.1)
LYMPHS ABS: 2.4 10*3/uL (ref 0.7–3.1)
Lymphs: 30 %
MCH: 23.2 pg — ABNORMAL LOW (ref 26.6–33.0)
MCHC: 31.5 g/dL (ref 31.5–35.7)
MCV: 74 fL — ABNORMAL LOW (ref 79–97)
MONOS ABS: 0.7 10*3/uL (ref 0.1–0.9)
Monocytes: 8 %
NEUTROS PCT: 57 %
Neutrophils Absolute: 4.5 10*3/uL (ref 1.4–7.0)
PLATELETS: 306 10*3/uL (ref 150–379)
RBC: 5.68 x10E6/uL — AB (ref 3.77–5.28)
RDW: 14.5 % (ref 12.3–15.4)
WBC: 8 10*3/uL (ref 3.4–10.8)

## 2018-02-27 LAB — T4, FREE: FREE T4: 1.18 ng/dL (ref 0.82–1.77)

## 2018-02-27 LAB — COMPREHENSIVE METABOLIC PANEL
ALT: 14 IU/L (ref 0–32)
AST: 15 IU/L (ref 0–40)
Albumin/Globulin Ratio: 1.8 (ref 1.2–2.2)
Albumin: 4.1 g/dL (ref 3.5–5.5)
Alkaline Phosphatase: 105 IU/L (ref 39–117)
BUN/Creatinine Ratio: 18 (ref 9–23)
BUN: 13 mg/dL (ref 6–24)
Bilirubin Total: 0.3 mg/dL (ref 0.0–1.2)
CALCIUM: 9.4 mg/dL (ref 8.7–10.2)
CO2: 21 mmol/L (ref 20–29)
CREATININE: 0.73 mg/dL (ref 0.57–1.00)
Chloride: 105 mmol/L (ref 96–106)
GFR calc Af Amer: 117 mL/min/{1.73_m2} (ref >59)
GFR, EST NON AFRICAN AMERICAN: 101 mL/min/{1.73_m2} (ref >59)
GLUCOSE: 105 mg/dL — AB (ref 65–99)
Globulin, Total: 2.3 g/dL (ref 1.5–4.5)
Potassium: 4.2 mmol/L (ref 3.5–5.2)
Sodium: 142 mmol/L (ref 134–144)
Total Protein: 6.4 g/dL (ref 6.0–8.5)

## 2018-02-27 LAB — HEMOGLOBIN A1C
ESTIMATED AVERAGE GLUCOSE: 137 mg/dL
HEMOGLOBIN A1C: 6.4 % — AB (ref 4.8–5.6)

## 2018-02-27 LAB — LIPID PANEL
CHOL/HDL RATIO: 3.6 ratio (ref 0.0–4.4)
Cholesterol, Total: 193 mg/dL (ref 100–199)
HDL: 54 mg/dL (ref >39)
LDL Calculated: 115 mg/dL — ABNORMAL HIGH (ref 0–99)
Triglycerides: 118 mg/dL (ref 0–149)
VLDL CHOLESTEROL CAL: 24 mg/dL (ref 5–40)

## 2018-02-27 LAB — RPR: RPR Ser Ql: NONREACTIVE

## 2018-02-27 LAB — GC/CHLAMYDIA PROBE AMP
Chlamydia trachomatis, NAA: NEGATIVE
NEISSERIA GONORRHOEAE BY PCR: NEGATIVE

## 2018-02-27 LAB — TSH: TSH: 0.741 u[IU]/mL (ref 0.450–4.500)

## 2018-02-27 LAB — HIV ANTIBODY (ROUTINE TESTING W REFLEX): HIV Screen 4th Generation wRfx: NONREACTIVE

## 2018-02-27 LAB — T3: T3, Total: 98 ng/dL (ref 71–180)

## 2018-03-03 ENCOUNTER — Telehealth: Payer: Self-pay | Admitting: Family Medicine

## 2018-03-03 NOTE — Telephone Encounter (Signed)
P.A. ALBUTEROL SUL HFA

## 2018-03-07 ENCOUNTER — Other Ambulatory Visit: Payer: Self-pay | Admitting: Family Medicine

## 2018-05-06 ENCOUNTER — Ambulatory Visit: Payer: Self-pay | Admitting: Family Medicine

## 2018-05-08 ENCOUNTER — Ambulatory Visit: Payer: Medicare HMO | Admitting: Family Medicine

## 2018-06-03 ENCOUNTER — Ambulatory Visit: Payer: Medicare HMO | Admitting: Family Medicine

## 2018-06-03 ENCOUNTER — Telehealth: Payer: Self-pay | Admitting: Internal Medicine

## 2018-06-03 NOTE — Telephone Encounter (Signed)

## 2018-06-03 NOTE — Telephone Encounter (Signed)
A. No follow up necessary. She made the appt to be seen for a new acute complaint

## 2018-06-14 NOTE — Telephone Encounter (Signed)
No show letter not sent/ no appt reminder calls made

## 2018-07-05 NOTE — Telephone Encounter (Signed)
Pt states insurance is paying for this

## 2018-08-13 DIAGNOSIS — Z23 Encounter for immunization: Secondary | ICD-10-CM | POA: Diagnosis not present

## 2018-08-13 DIAGNOSIS — E785 Hyperlipidemia, unspecified: Secondary | ICD-10-CM | POA: Diagnosis not present

## 2018-08-13 DIAGNOSIS — R7303 Prediabetes: Secondary | ICD-10-CM | POA: Diagnosis not present

## 2018-08-13 DIAGNOSIS — M419 Scoliosis, unspecified: Secondary | ICD-10-CM | POA: Diagnosis not present

## 2018-08-13 DIAGNOSIS — Z79899 Other long term (current) drug therapy: Secondary | ICD-10-CM | POA: Diagnosis not present

## 2018-08-13 DIAGNOSIS — Z720 Tobacco use: Secondary | ICD-10-CM | POA: Diagnosis not present

## 2018-08-13 DIAGNOSIS — J4540 Moderate persistent asthma, uncomplicated: Secondary | ICD-10-CM | POA: Diagnosis not present

## 2018-12-29 ENCOUNTER — Emergency Department (HOSPITAL_COMMUNITY)
Admission: EM | Admit: 2018-12-29 | Discharge: 2018-12-29 | Disposition: A | Discharge status: Home / Self Care | Payer: Medicare HMO | Attending: Emergency Medicine | Admitting: Emergency Medicine

## 2018-12-29 DIAGNOSIS — J45909 Unspecified asthma, uncomplicated: Secondary | ICD-10-CM | POA: Insufficient documentation

## 2018-12-29 DIAGNOSIS — J029 Acute pharyngitis, unspecified: Secondary | ICD-10-CM | POA: Insufficient documentation

## 2018-12-29 DIAGNOSIS — Z79899 Other long term (current) drug therapy: Secondary | ICD-10-CM | POA: Diagnosis not present

## 2018-12-29 DIAGNOSIS — Z87891 Personal history of nicotine dependence: Secondary | ICD-10-CM | POA: Diagnosis not present

## 2018-12-29 LAB — GROUP A STREP BY PCR: Group A Strep by PCR: NOT DETECTED

## 2018-12-29 NOTE — ED Triage Notes (Signed)
Pt reports sore throat X3-4 days.

## 2018-12-29 NOTE — Discharge Instructions (Addendum)
Continue Tylenol and/or ibuprofen for discomfort. Follow up with your doctor for recheck if symptoms continue.

## 2018-12-29 NOTE — ED Provider Notes (Signed)
[facility] EMERGENCY DEPARTMENT Provider Note   CSN: [id number] Arrival date & time: 12/29/18  2120     History   Chief Complaint Chief Complaint  Patient presents with  . Sore Throat    HPI Robin Richards is a 43 y.o. female.  Patient to ED with sore throat for the past 4 days. No fever, significant congestion, cough, nausea. She is able to eat and drink without difficulty. She is losing her voice over time and felt she was getting worse. No known sick contacts.   The history is provided by the patient.    Past Medical History:  Diagnosis Date  . Arthritis   . Asthma   . Elevated hemoglobin A1c   . Heartburn   . Scoliosis     Patient Active Problem List   Diagnosis Date Noted  . Elevated LDL cholesterol level 02/26/2018  . Low TSH level 02/26/2018  . Scoliosis   . Arthritis   . Seasonal allergies 02/23/2016  . Asthma, moderate persistent 02/23/2016  . Smoker 02/23/2016  . Prediabetes 04/09/2015  . Well woman exam 04/09/2015    Past Surgical History:  Procedure Laterality Date  . SPINE SURGERY       OB History   No obstetric history on file.      Home Medications    Prior to Admission medications   Medication Sig Start Date End Date Taking? Authorizing Provider  albuterol (PROVENTIL HFA;VENTOLIN HFA) 108 (90 Base) MCG/ACT inhaler Inhale 1-2 puffs into the lungs every 6 (six) hours as needed for wheezing or shortness of breath. 01/28/18   Henson, Vickie L, NP-C  cyclobenzaprine (FLEXERIL) 10 MG tablet Take 1 tablet (10 mg total) by mouth 3 (three) times daily as needed for muscle spasms. 09/12/17   Henson, Vickie L, NP-C  diclofenac (VOLTAREN) 75 MG EC tablet Take 1 tablet (75 mg total) by mouth 2 (two) times daily. 01/02/18   Henson, Vickie L, NP-C  fluticasone (FLONASE) 50 MCG/ACT nasal spray Place 2 sprays into both nostrils daily. 01/28/18   Henson, Vickie L, NP-C  fluticasone furoate-vilanterol (BREO ELLIPTA) 200-25 MCG/INH AEPB Inhale  1 puff into the lungs daily. 01/07/18   Henson, Vickie L, NP-C  ibuprofen (ADVIL,MOTRIN) 600 MG tablet TAKE 1 TABLET BY MOUTH EVERY 6 HOURS AS NEEDED FOR FEVER,HEADACHE,OR MILD PAIN 03/07/18   Girtha Rm, NP-C    Family History Family History  Problem Relation Age of Onset  . Cirrhosis Mother   . Cancer Father   . Hypertension Sister   . Thyroid disease Sister   . Breast cancer Maternal Grandmother     Social History Social History   Tobacco Use  . Smoking status: Former Smoker    Packs/day: 0.50    Years: 20.00    Pack years: 10.00    Types: Cigarettes    Last attempt to quit: 02/17/2017    Years since quitting: 1.8  . Smokeless tobacco: Never Used  Substance Use Topics  . Alcohol use: No    Alcohol/week: 0.0 standard drinks  . Drug use: No     Allergies   Patient has no known allergies.   Review of Systems Review of Systems  Constitutional: Negative for fever.  HENT: Positive for sore throat and voice change. Negative for congestion and trouble swallowing.   Respiratory: Negative for cough.   Gastrointestinal: Negative for nausea.  Musculoskeletal: Negative for myalgias.  Skin: Negative for rash.     Physical Exam Updated Vital Signs  BP 121/75   Pulse 74   Temp 98.1 F (36.7 C) (Oral)   Resp 16   SpO2 99%   Physical Exam Vitals signs and nursing note reviewed.  Constitutional:      Appearance: She is well-developed.  HENT:     Head: Normocephalic.     Mouth/Throat:     Mouth: Mucous membranes are moist.     Pharynx: Posterior oropharyngeal erythema present. No pharyngeal swelling or oropharyngeal exudate.  Neck:     Musculoskeletal: Normal range of motion and neck supple.  Cardiovascular:     Rate and Rhythm: Normal rate and regular rhythm.     Heart sounds: No murmur.  Pulmonary:     Effort: Pulmonary effort is normal.     Breath sounds: Normal breath sounds. No wheezing, rhonchi or rales.  Musculoskeletal: Normal range of motion.    Skin:    General: Skin is warm and dry.     Findings: No rash.  Neurological:     Mental Status: She is alert.     Cranial Nerves: No cranial nerve deficit.      ED Treatments / Results  Labs (all labs ordered are listed, but only abnormal results are displayed) Labs Reviewed  GROUP A STREP BY PCR   Results for orders placed or performed during the hospital encounter of 12/29/18  Group A Strep by PCR  Result Value Ref Range   Group A Strep by PCR NOT DETECTED NOT DETECTED    EKG None  Radiology No results found.  Procedures Procedures (including critical care time)  Medications Ordered in ED Medications - No data to display   Initial Impression / Assessment and Plan / ED Course  I have reviewed the triage vital signs and the nursing notes.  Pertinent labs & imaging results that were available during my care of the patient were reviewed by me and considered in my medical decision making (see chart for details).     Patient to ED with isolated symptoms of sore throat and hoarseness. No fever, congestion or cough.   Strep negative. She likely has a viral illness requiring supportive care.   Final Clinical Impressions(s) / ED Diagnoses   Final diagnoses:  None   1. pharyngitis   ED Discharge Orders    None       Charlann Lange, PA-C 12/30/18 0559    Tegeler, Gwenyth Allegra, MD 12/30/18 [phone]

## 2019-02-05 ENCOUNTER — Telehealth: Payer: Self-pay | Admitting: Family Medicine

## 2019-02-05 NOTE — Telephone Encounter (Signed)
Dismissal letter in guarantor snapshot

## 2019-07-23 ENCOUNTER — Other Ambulatory Visit: Payer: Self-pay | Admitting: Family

## 2019-07-23 DIAGNOSIS — Z1231 Encounter for screening mammogram for malignant neoplasm of breast: Secondary | ICD-10-CM | POA: Diagnosis not present

## 2019-07-23 DIAGNOSIS — J4540 Moderate persistent asthma, uncomplicated: Secondary | ICD-10-CM | POA: Diagnosis not present

## 2019-07-23 DIAGNOSIS — Z0000 Encounter for general adult medical examination without abnormal findings: Secondary | ICD-10-CM | POA: Diagnosis not present

## 2019-07-23 DIAGNOSIS — F17210 Nicotine dependence, cigarettes, uncomplicated: Secondary | ICD-10-CM | POA: Diagnosis not present

## 2019-07-23 DIAGNOSIS — Z008 Encounter for other general examination: Secondary | ICD-10-CM | POA: Diagnosis not present

## 2019-07-23 DIAGNOSIS — E119 Type 2 diabetes mellitus without complications: Secondary | ICD-10-CM | POA: Diagnosis not present

## 2019-07-23 DIAGNOSIS — H9190 Unspecified hearing loss, unspecified ear: Secondary | ICD-10-CM | POA: Diagnosis not present

## 2019-07-23 DIAGNOSIS — Z79899 Other long term (current) drug therapy: Secondary | ICD-10-CM | POA: Diagnosis not present

## 2019-07-23 DIAGNOSIS — Z23 Encounter for immunization: Secondary | ICD-10-CM | POA: Diagnosis not present

## 2019-07-28 DIAGNOSIS — E663 Overweight: Secondary | ICD-10-CM | POA: Diagnosis not present

## 2019-07-28 DIAGNOSIS — Z124 Encounter for screening for malignant neoplasm of cervix: Secondary | ICD-10-CM | POA: Diagnosis not present

## 2019-07-28 DIAGNOSIS — Z008 Encounter for other general examination: Secondary | ICD-10-CM | POA: Diagnosis not present

## 2019-07-28 DIAGNOSIS — Z6825 Body mass index (BMI) 25.0-25.9, adult: Secondary | ICD-10-CM | POA: Diagnosis not present

## 2019-09-04 ENCOUNTER — Ambulatory Visit: Payer: Commercial Managed Care - HMO

## 2019-11-05 ENCOUNTER — Other Ambulatory Visit: Payer: Self-pay | Admitting: Family

## 2019-11-05 ENCOUNTER — Ambulatory Visit
Admission: RE | Admit: 2019-11-05 | Discharge: 2019-11-05 | Disposition: A | Discharge status: Home / Self Care | Payer: Medicare HMO | Source: Ambulatory Visit | Attending: Family | Admitting: Family

## 2019-11-05 DIAGNOSIS — S8392XA Sprain of unspecified site of left knee, initial encounter: Secondary | ICD-10-CM

## 2019-11-11 ENCOUNTER — Other Ambulatory Visit: Payer: Self-pay | Admitting: Family

## 2019-11-11 ENCOUNTER — Other Ambulatory Visit (HOSPITAL_COMMUNITY): Payer: Self-pay | Admitting: Family

## 2019-11-11 DIAGNOSIS — M25562 Pain in left knee: Secondary | ICD-10-CM

## 2019-11-20 ENCOUNTER — Ambulatory Visit (HOSPITAL_COMMUNITY): Payer: Medicare HMO

## 2019-11-20 ENCOUNTER — Encounter (HOSPITAL_COMMUNITY): Payer: Self-pay

## 2019-11-25 ENCOUNTER — Other Ambulatory Visit: Payer: Self-pay

## 2019-11-25 ENCOUNTER — Ambulatory Visit (INDEPENDENT_AMBULATORY_CARE_PROVIDER_SITE_OTHER): Payer: Medicare HMO | Admitting: Orthopaedic Surgery

## 2019-11-25 DIAGNOSIS — M25562 Pain in left knee: Secondary | ICD-10-CM

## 2019-11-25 MED ORDER — LIDOCAINE HCL 1 % IJ SOLN
3.0000 mL | INTRAMUSCULAR | Status: AC | PRN
  Administered 2019-11-25: 09:00:00 3 mL

## 2019-11-25 MED ORDER — METHYLPREDNISOLONE ACETATE 40 MG/ML IJ SUSP
40.0000 mg | INTRAMUSCULAR | Status: AC | PRN
  Administered 2019-11-25: 40 mg via INTRA_ARTICULAR

## 2019-11-25 NOTE — Progress Notes (Signed)
Office Visit Note   Patient: Robin Richards           Date of Birth: [birth year]           MRN: [id number] Visit Date: 11/25/2019              Requested by: Sonia Side., FNP [place],  [place] [address] PCP: Sonia Side., FNP   Assessment & Plan: Visit Diagnoses:  1. Acute pain of left knee     Plan: I went over x-rays and exam with her in detail.  I recommended a steroid injection in her left knee as well as a knee brace and quad strengthening exercises.  She agrees with this treatment plan.  I explained the risk and benefits of steroid injections.  She did tolerate it well in the left knee.  We will see her back in 4 weeks to see how she is doing overall.  All question concerns were answered and addressed.  If she still having significant symptoms we could consider an MRI if the conservative treatment fails.  Follow-Up Instructions: Return in about 4 weeks (around 12/23/2019).   Orders:  Orders Placed This Encounter  Procedures  . Large Joint Inj   No orders of the defined types were placed in this encounter.     Procedures: Large Joint Inj: L knee on 11/25/2019 8:54 AM Indications: diagnostic evaluation and pain Details: 22 G 1.5 in needle, superolateral approach  Arthrogram: No  Medications: 3 mL lidocaine 1 %; 40  mg methylPREDNISolone acetate 40 MG/ML Outcome: tolerated well,  no immediate complications Procedure, treatment alternatives, risks and benefits explained, specific risks discussed. Consent was given by the patient. Immediately prior to procedure a time out was called to verify the correct patient, procedure, equipment, support staff and site/side marked as required. Patient was prepped and draped in the usual sterile fashion.       Clinical Data: No additional findings.   Subjective: Chief Complaint  Patient presents with  . Left Knee - Pain  The patient is someone I am seeing for the first time for acute left knee pain.  She has been having knee pain for about 3 to 4 weeks with no known injury and some swelling.  She is referred from Dustin Folks, nurse practitioner.  She does have x-rays that accompany her.  She says it does hurt to bend her knee back and forth.  She says a constant type of aching pain and she points around her whole knee as far as global pain.  She denies any instability symptoms.  She has not had surgery on this knee before.  She reports being a prediabetic and she is a heavy smoker.  HPI  Review of Systems She currently denies any headache, chest pain, shortness of breath, fever, chills, nausea, vomiting  Objective: Vital Signs: There were no vitals taken for this visit.  Physical Exam She is alert and oriented x3 in no acute distress Ortho Exam Examination of her left knee shows some global tenderness but good range of motion.  The knee is ligamentously stable.  There is no effusion and her range of motion is full. Specialty Comments:  No specialty comments available.  Imaging: No results found. X-rays on the canopy system independently reviewed of her left knee showed no acute findings.  The joint space is well-maintained.  PMFS History: Patient Active Problem List   Diagnosis Date Noted  . Elevated LDL cholesterol level 02/26/2018  . Low TSH level  02/26/2018  . Scoliosis   . Arthritis   . Seasonal allergies 02/23/2016  . Asthma, moderate persistent 02/23/2016  . Smoker 02/23/2016  . Prediabetes 04/09/2015  . Well woman exam 04/09/2015   Past Medical History:  Diagnosis Date  . Arthritis   . Asthma   . Elevated hemoglobin A1c   . Heartburn   . Scoliosis     Family History  Problem Relation Age of Onset  . Cirrhosis Mother   . Cancer Father   . Hypertension Sister   . Thyroid disease Sister   . Breast cancer Maternal Grandmother     Past Surgical History:  Procedure Laterality Date  . SPINE SURGERY     Social History   Occupational History  . Not on file  Tobacco Use  . Smoking status: Former Smoker    Packs/day: 0.50    Years: 20.00    Pack years: 10.00    Types: Cigarettes    Quit date: 02/17/2017    Years since quitting: 2.7  . Smokeless tobacco: Never Used  Substance and Sexual Activity  . Alcohol use: No    Alcohol/week: 0.0 standard drinks  . Drug use: No  . Sexual activity: Not Currently    Partners: Female

## 2019-12-23 ENCOUNTER — Encounter: Payer: Self-pay | Admitting: Orthopaedic Surgery

## 2019-12-23 ENCOUNTER — Other Ambulatory Visit: Payer: Self-pay

## 2019-12-23 ENCOUNTER — Ambulatory Visit (INDEPENDENT_AMBULATORY_CARE_PROVIDER_SITE_OTHER): Payer: Medicare HMO | Admitting: Orthopaedic Surgery

## 2019-12-23 DIAGNOSIS — M25562 Pain in left knee: Secondary | ICD-10-CM

## 2019-12-23 NOTE — Progress Notes (Signed)
Office Visit Note   Patient: Robin Richards           Date of Birth: Jan 29, 1976           MRN: [id number] Visit Date: 12/23/2019              Requested by: Robin Side., FNP [place],  [place] [address] PCP: Robin Side., FNP   Assessment & Plan: Visit Diagnoses:  1. Acute pain of left knee     Plan: Due to the fact the patient is having increasing knee pain despite conservative treatment and given her radiographs of the left knee recommend MRI of the left knee rule out meniscal tear.  Patient will follow up after the MRI to go over results and discuss further treatment.  Questions encouraged and answered.   Follow-Up Instructions: Return After MRI.   Orders:  No orders of the defined types were placed in this encounter.  No orders of the defined types were placed in this encounter.     Procedures: No procedures performed   Clinical Data: No additional findings.   Subjective: Chief Complaint  Patient presents with  . Left Knee - Follow-up    HPI Robin Richards returns today for follow-up of her left knee.  4 weeks ago she underwent left knee injection states that it helped initially.  That slowly wearing off.  She is starting to have soreness about the knee and a giving way sensation.  Denies any locking, catching or painful popping.  She is using a knee brace which seems to help some.  No new injury to the knee. Review of Systems Negative for fevers or chills. See HPI  Objective: Vital Signs: Ht 5' 2"  (1.575 m)   Wt 142 lb (64.4 kg)   BMI 25.97 kg/m   Physical Exam Constitutional:      Appearance: She is not ill-appearing or diaphoretic.  Neurological:     Mental Status: She is alert and oriented to person, place, and time.  Psychiatric:        Mood and Affect: Mood normal.        Behavior: Behavior normal.     Ortho Exam Left knee: No abnormal warmth: No effusion or erythema.  Full range of motion of the knee.  Tenderness along  medial joint line McMurray's positive.  Patellofemoral crepitus passive range of motion.  No instability valgus varus stressing.  Left hip good range of motion without pain.  Slight tenderness over the left trochanteric region. Specialty Comments:  No specialty comments available.  Imaging: No results found.   PMFS History: Patient Active Problem List   Diagnosis Date Noted  . Elevated LDL cholesterol level 02/26/2018  . Low TSH level 02/26/2018  . Scoliosis   . Arthritis   . Seasonal allergies 02/23/2016  . Asthma, moderate persistent 02/23/2016  . Smoker 02/23/2016  . Prediabetes 04/09/2015  . Well woman exam 04/09/2015   Past Medical History:  Diagnosis Date  . Arthritis   . Asthma   . Elevated hemoglobin A1c   . Heartburn   . Scoliosis     Family History  Problem Relation Age of Onset  . Cirrhosis Mother   . Cancer Father   . Hypertension Sister   . Thyroid disease Sister   . Breast cancer Maternal Grandmother     Past Surgical History:  Procedure Laterality Date  . SPINE SURGERY     Social History   Occupational History  .  Not on file  Tobacco Use  . Smoking status: Former Smoker    Packs/day: 0.50    Years: 20.00    Pack years: 10.00    Types: Cigarettes    Quit date: 02/17/2017    Years since quitting: 2.8  . Smokeless tobacco: Never Used  Substance and Sexual Activity  . Alcohol use: No    Alcohol/week: 0.0 standard drinks  . Drug use: No  . Sexual activity: Not Currently    Partners: Female

## 2019-12-23 NOTE — Addendum Note (Signed)
Addended by: Michae Kava B on: 12/23/2019 09:37 AM   Modules accepted: Orders

## 2020-01-19 ENCOUNTER — Other Ambulatory Visit: Payer: Self-pay

## 2020-01-19 ENCOUNTER — Ambulatory Visit
Admission: RE | Admit: 2020-01-19 | Discharge: 2020-01-19 | Disposition: A | Discharge status: Home / Self Care | Payer: Medicare HMO | Source: Ambulatory Visit | Attending: Orthopaedic Surgery | Admitting: Orthopaedic Surgery

## 2020-01-19 DIAGNOSIS — M25562 Pain in left knee: Secondary | ICD-10-CM

## 2021-03-30 ENCOUNTER — Encounter (INDEPENDENT_AMBULATORY_CARE_PROVIDER_SITE_OTHER): Payer: Self-pay | Admitting: Otolaryngology

## 2021-03-30 ENCOUNTER — Other Ambulatory Visit: Payer: Self-pay

## 2021-03-30 ENCOUNTER — Ambulatory Visit (INDEPENDENT_AMBULATORY_CARE_PROVIDER_SITE_OTHER): Payer: Medicare Other | Admitting: Otolaryngology

## 2021-03-30 VITALS — Temp 97.2°F

## 2021-03-30 DIAGNOSIS — H9202 Otalgia, left ear: Secondary | ICD-10-CM | POA: Diagnosis not present

## 2021-03-30 DIAGNOSIS — M26609 Unspecified temporomandibular joint disorder, unspecified side: Secondary | ICD-10-CM | POA: Diagnosis not present

## 2021-03-30 NOTE — Progress Notes (Signed)
HPI: Robin Richards is a 45 y.o. female who presents is referred by [facility] for evaluation of left ear pain.  She wears hearing aids in both ears.  She has been having pain in the left ear on and off for several months now.  She was were referred by [facility] for evaluation of the left ear pain.  She had several teeth removed about 3 to 4 months ago..  Past Medical History:  Diagnosis Date  . Arthritis   . Asthma   . Elevated hemoglobin A1c   . Heartburn   . Scoliosis    Past Surgical History:  Procedure Laterality Date  . SPINE SURGERY     Social History   Socioeconomic History  . Marital status: Single    Spouse name: Not on file  . Number of children: Not on file  . Years of education: Not on file  . Highest education level: Not on file  Occupational History  . Not on file  Tobacco Use  . Smoking status: Former Smoker    Packs/day: 0.50    Years: 20.00    Pack years: 10.00    Types: Cigarettes    Quit date: 02/17/2017    Years since quitting: 4.1  . Smokeless tobacco: Never Used  Vaping Use  . Vaping Use: Never used  Substance and Sexual Activity  . Alcohol use: No    Alcohol/week: 0.0 standard drinks  . Drug use: No  . Sexual activity: Not Currently    Partners: Female  Other Topics Concern  . Not on file  Social History Narrative  . Not on file   Social Determinants of Health   Financial Resource Strain: Not on file  Food Insecurity: Not on file  Transportation Needs: Not on file  Physical Activity: Not on file  Stress: Not on file  Social Connections: Not on file   Family History  Problem Relation Age of Onset  . Cirrhosis Mother   . Cancer Father   . Hypertension Sister   . Thyroid disease Sister   . Breast cancer Maternal Grandmother    No Known Allergies Prior to Admission medications   Medication Sig Start Date End Date Taking? Authorizing Provider  albuterol (PROVENTIL HFA;VENTOLIN HFA) 108 (90 Base) MCG/ACT inhaler  Inhale 1-2 puffs into the lungs every 6 (six) hours as needed for wheezing or shortness of breath. 01/28/18   Henson, Vickie L, NP-C  cyclobenzaprine (FLEXERIL) 10 MG tablet Take 1 tablet (10 mg total) by mouth 3 (three) times daily as needed for muscle spasms. 09/12/17   Henson, Vickie L, NP-C  diclofenac (VOLTAREN) 75 MG EC tablet Take 1 tablet (75 mg total) by mouth 2 (two) times daily. 01/02/18   Henson, Vickie L, NP-C  fluticasone (FLONASE) 50 MCG/ACT nasal spray Place 2 sprays into both nostrils daily. 01/28/18   Henson, Vickie L, NP-C  fluticasone furoate-vilanterol (BREO ELLIPTA) 200-25 MCG/INH AEPB Inhale 1 puff into the lungs daily. 01/07/18   Henson, Vickie L, NP-C  ibuprofen (ADVIL,MOTRIN) 600 MG tablet TAKE 1 TABLET BY MOUTH EVERY 6 HOURS AS NEEDED FOR FEVER,HEADACHE,OR MILD PAIN 03/07/18   Henson, Vickie L, NP-C     Positive ROS: Otherwise negative  All other systems have been reviewed and were otherwise negative with the exception of those mentioned in the HPI and as above.  Physical Exam: Constitutional: Alert, well-appearing, no acute distress Ears: External ears without lesions or tenderness. Ear canals are clear bilaterally.  There is no external  otitis.  Left ear canal is completely clear with no inflammation noted.  The TMs likewise are clear bilaterally with no TM abnormality.  The right TM is little bit thickened anteriorly where she had a previous tube placed when she was younger.  Left TM is clear. Nasal: External nose without lesions. Septum with mild deformity.. Clear nasal passages Oral: Lips and gums without lesions. Tongue and palate mucosa without lesions. Posterior oropharynx clear.  Patient is status post tonsillectomy. Neck: No palpable adenopathy or masses.  She has perhaps some mild swelling in the left TMJ region compared to the right but this is minimal.  The skin over this area is soft with no erythema.  The pain is mostly located around the TMJ area and left jaw.  No  palpable adenopathy lower in the neck. Respiratory: Breathing comfortably  Skin: No facial/neck lesions or rash noted.  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  Plan: Recommended a soft diet and taking NSAIDs for 5 to 7days. Recommended follow-up with her dentist. She will follow-up here if symptoms worsen or do not improve within 2 weeks.   Radene Journey, MD   CC:

## 2022-03-22 ENCOUNTER — Emergency Department (HOSPITAL_COMMUNITY): Payer: Medicare Other

## 2022-03-22 ENCOUNTER — Encounter (HOSPITAL_COMMUNITY)
Admission: EM | Disposition: A | Discharge status: Home / Self Care | Payer: Self-pay | Source: Home / Self Care | Attending: Internal Medicine

## 2022-03-22 ENCOUNTER — Other Ambulatory Visit: Payer: Self-pay

## 2022-03-22 ENCOUNTER — Inpatient Hospital Stay (HOSPITAL_COMMUNITY)
Admission: EM | Admit: 2022-03-22 | Discharge: 2022-03-24 | DRG: 137 | Disposition: A | Discharge status: Home / Self Care | Payer: Medicare Other | Attending: Internal Medicine | Admitting: Internal Medicine

## 2022-03-22 ENCOUNTER — Emergency Department (HOSPITAL_COMMUNITY): Payer: Medicare Other | Admitting: Anesthesiology

## 2022-03-22 ENCOUNTER — Encounter (HOSPITAL_COMMUNITY): Payer: Self-pay | Admitting: Anesthesiology

## 2022-03-22 ENCOUNTER — Emergency Department (EMERGENCY_DEPARTMENT_HOSPITAL): Payer: Medicare Other | Admitting: Anesthesiology

## 2022-03-22 DIAGNOSIS — J4540 Moderate persistent asthma, uncomplicated: Secondary | ICD-10-CM | POA: Diagnosis present

## 2022-03-22 DIAGNOSIS — M272 Inflammatory conditions of jaws: Secondary | ICD-10-CM

## 2022-03-22 DIAGNOSIS — K047 Periapical abscess without sinus: Principal | ICD-10-CM | POA: Diagnosis present

## 2022-03-22 DIAGNOSIS — R220 Localized swelling, mass and lump, head: Secondary | ICD-10-CM

## 2022-03-22 DIAGNOSIS — E785 Hyperlipidemia, unspecified: Secondary | ICD-10-CM | POA: Diagnosis present

## 2022-03-22 DIAGNOSIS — L03211 Cellulitis of face: Secondary | ICD-10-CM | POA: Diagnosis not present

## 2022-03-22 DIAGNOSIS — Z87891 Personal history of nicotine dependence: Secondary | ICD-10-CM

## 2022-03-22 DIAGNOSIS — Z7951 Long term (current) use of inhaled steroids: Secondary | ICD-10-CM

## 2022-03-22 DIAGNOSIS — Z79899 Other long term (current) drug therapy: Secondary | ICD-10-CM

## 2022-03-22 HISTORY — PX: TOOTH EXTRACTION: SHX859

## 2022-03-22 HISTORY — PX: INCISION AND DRAINAGE ABSCESS: SHX5864

## 2022-03-22 LAB — COMPREHENSIVE METABOLIC PANEL
ALT: 14 U/L (ref 0–44)
AST: 12 U/L — ABNORMAL LOW (ref 15–41)
Albumin: 3.7 g/dL (ref 3.5–5.0)
Alkaline Phosphatase: 130 U/L — ABNORMAL HIGH (ref 38–126)
Anion gap: 8 (ref 5–15)
BUN: 12 mg/dL (ref 6–20)
CO2: 21 mmol/L — ABNORMAL LOW (ref 22–32)
Calcium: 9.9 mg/dL (ref 8.9–10.3)
Chloride: 107 mmol/L (ref 98–111)
Creatinine, Ser: 0.59 mg/dL (ref 0.44–1.00)
GFR, Estimated: >60 mL/min (ref >60)
Glucose, Bld: 180 mg/dL — ABNORMAL HIGH (ref 70–99)
Potassium: 4.1 mmol/L (ref 3.5–5.1)
Sodium: 136 mmol/L (ref 135–145)
Total Bilirubin: 0.9 mg/dL (ref 0.3–1.2)
Total Protein: 6.8 g/dL (ref 6.5–8.1)

## 2022-03-22 LAB — CBC WITH DIFFERENTIAL/PLATELET
Abs Immature Granulocytes: 0.05 10*3/uL (ref 0.00–0.07)
Basophils Absolute: 0 10*3/uL (ref 0.0–0.1)
Basophils Relative: 0 %
Eosinophils Absolute: 0.3 10*3/uL (ref 0.0–0.5)
Eosinophils Relative: 2 %
HCT: 44.2 % (ref 36.0–46.0)
Hemoglobin: 13.3 g/dL (ref 12.0–15.0)
Immature Granulocytes: 0 %
Lymphocytes Relative: 15 %
Lymphs Abs: 2 10*3/uL (ref 0.7–4.0)
MCH: 22.1 pg — ABNORMAL LOW (ref 26.0–34.0)
MCHC: 30.1 g/dL (ref 30.0–36.0)
MCV: 73.4 fL — ABNORMAL LOW (ref 80.0–100.0)
Monocytes Absolute: 1.5 10*3/uL — ABNORMAL HIGH (ref 0.1–1.0)
Monocytes Relative: 11 %
Neutro Abs: 9.8 10*3/uL — ABNORMAL HIGH (ref 1.7–7.7)
Neutrophils Relative %: 72 %
Platelets: 223 10*3/uL (ref 150–400)
RBC: 6.02 MIL/uL — ABNORMAL HIGH (ref 3.87–5.11)
RDW: 14.9 % (ref 11.5–15.5)
WBC: 13.8 10*3/uL — ABNORMAL HIGH (ref 4.0–10.5)
nRBC: 0 % (ref 0.0–0.2)

## 2022-03-22 LAB — I-STAT BETA HCG BLOOD, ED (MC, WL, AP ONLY): I-stat hCG, quantitative: <5 m[IU]/mL (ref <5)

## 2022-03-22 LAB — LACTIC ACID, PLASMA: Lactic Acid, Venous: 1 mmol/L (ref 0.5–1.9)

## 2022-03-22 SURGERY — INCISION AND DRAINAGE, ABSCESS
Anesthesia: General

## 2022-03-22 MED ORDER — DEXAMETHASONE SODIUM PHOSPHATE 10 MG/ML IJ SOLN
INTRAMUSCULAR | Status: AC
Start: 2022-03-22 — End: ?
  Filled 2022-03-22: qty 1

## 2022-03-22 MED ORDER — HYDROMORPHONE HCL 1 MG/ML IJ SOLN
INTRAMUSCULAR | Status: AC
  Filled 2022-03-22: qty 1

## 2022-03-22 MED ORDER — ACETAMINOPHEN 10 MG/ML IV SOLN
INTRAVENOUS | Status: DC | PRN
  Administered 2022-03-22: 1000 mg via INTRAVENOUS

## 2022-03-22 MED ORDER — MIDAZOLAM HCL 2 MG/2ML IJ SOLN
INTRAMUSCULAR | Status: DC | PRN
  Administered 2022-03-22: 2 mg via INTRAVENOUS

## 2022-03-22 MED ORDER — ONDANSETRON HCL 4 MG/2ML IJ SOLN
4.0000 mg | Freq: Four times a day (QID) | INTRAMUSCULAR | Status: DC | PRN
  Filled 2022-03-22: qty 2

## 2022-03-22 MED ORDER — ACETAMINOPHEN 10 MG/ML IV SOLN
INTRAVENOUS | Status: AC
  Filled 2022-03-22: qty 100

## 2022-03-22 MED ORDER — PHENYLEPHRINE 80 MCG/ML (10ML) SYRINGE FOR IV PUSH (FOR BLOOD PRESSURE SUPPORT)
PREFILLED_SYRINGE | INTRAVENOUS | Status: DC | PRN
  Administered 2022-03-22: 160 ug via INTRAVENOUS

## 2022-03-22 MED ORDER — LACTATED RINGERS IV SOLN: INTRAVENOUS | Status: DC

## 2022-03-22 MED ORDER — OXYCODONE-ACETAMINOPHEN 5-325 MG PO TABS
ORAL_TABLET | ORAL | Status: AC
  Filled 2022-03-22: qty 2

## 2022-03-22 MED ORDER — LIDOCAINE-EPINEPHRINE 2 %-1:100000 IJ SOLN
INTRAMUSCULAR | Status: DC | PRN
  Administered 2022-03-22: 3 mL via INTRADERMAL

## 2022-03-22 MED ORDER — CHLORHEXIDINE GLUCONATE 0.12 % MT SOLN
OROMUCOSAL | Status: AC
Start: 2022-03-22 — End: 2022-03-22
  Administered 2022-03-22: 15 mL via OROMUCOSAL
  Filled 2022-03-22: qty 15

## 2022-03-22 MED ORDER — ACETAMINOPHEN 325 MG PO TABS
650.0000 mg | ORAL_TABLET | Freq: Four times a day (QID) | ORAL | Status: DC | PRN
  Administered 2022-03-24: 650 mg via ORAL
  Filled 2022-03-22 (×2): qty 2

## 2022-03-22 MED ORDER — LIDOCAINE 2% (20 MG/ML) 5 ML SYRINGE
INTRAMUSCULAR | Status: DC | PRN
  Administered 2022-03-22: 40 mg via INTRAVENOUS
  Administered 2022-03-22: 60 mg via INTRAVENOUS

## 2022-03-22 MED ORDER — OXYMETAZOLINE HCL 0.05 % NA SOLN
NASAL | Status: AC
  Filled 2022-03-22: qty 30

## 2022-03-22 MED ORDER — SUGAMMADEX SODIUM 200 MG/2ML IV SOLN
INTRAVENOUS | Status: DC | PRN
  Administered 2022-03-22: 200 mg via INTRAVENOUS

## 2022-03-22 MED ORDER — PROPOFOL 10 MG/ML IV BOLUS
INTRAVENOUS | Status: DC | PRN
Start: 2022-03-22 — End: 2022-03-22
  Administered 2022-03-22: 20 mg via INTRAVENOUS
  Administered 2022-03-22: 180 mg via INTRAVENOUS
  Administered 2022-03-22: 30 mg via INTRAVENOUS

## 2022-03-22 MED ORDER — MORPHINE SULFATE (PF) 4 MG/ML IV SOLN
4.0000 mg | Freq: Once | INTRAVENOUS | Status: AC
  Administered 2022-03-22: 4 mg via INTRAVENOUS
  Filled 2022-03-22: qty 1

## 2022-03-22 MED ORDER — MIDAZOLAM HCL 2 MG/2ML IJ SOLN
INTRAMUSCULAR | Status: AC
  Filled 2022-03-22: qty 2

## 2022-03-22 MED ORDER — AMISULPRIDE (ANTIEMETIC) 5 MG/2ML IV SOLN: 10.0000 mg | Freq: Once | INTRAVENOUS | Status: DC | PRN

## 2022-03-22 MED ORDER — LIDOCAINE 2% (20 MG/ML) 5 ML SYRINGE
INTRAMUSCULAR | Status: AC
  Filled 2022-03-22: qty 5

## 2022-03-22 MED ORDER — FENTANYL CITRATE (PF) 250 MCG/5ML IJ SOLN
INTRAMUSCULAR | Status: DC | PRN
  Administered 2022-03-22: 50 ug via INTRAVENOUS
  Administered 2022-03-22: 100 ug via INTRAVENOUS
  Administered 2022-03-22 (×2): 50 ug via INTRAVENOUS

## 2022-03-22 MED ORDER — SODIUM CHLORIDE 0.9 % IV SOLN
3.0000 g | Freq: Once | INTRAVENOUS | Status: AC
  Administered 2022-03-22: 3 g via INTRAVENOUS
  Filled 2022-03-22 (×2): qty 8

## 2022-03-22 MED ORDER — MORPHINE SULFATE (PF) 2 MG/ML IV SOLN: 1.0000 mg | INTRAVENOUS | Status: DC | PRN

## 2022-03-22 MED ORDER — LIDOCAINE-EPINEPHRINE 2 %-1:100000 IJ SOLN
INTRAMUSCULAR | Status: AC
  Filled 2022-03-22: qty 1

## 2022-03-22 MED ORDER — CHLORHEXIDINE GLUCONATE 0.12 % MT SOLN: 15.0000 mL | Freq: Once | OROMUCOSAL | Status: AC

## 2022-03-22 MED ORDER — DEXMEDETOMIDINE (PRECEDEX) IN NS 20 MCG/5ML (4 MCG/ML) IV SYRINGE
PREFILLED_SYRINGE | INTRAVENOUS | Status: DC | PRN
  Administered 2022-03-22 (×2): 8 ug via INTRAVENOUS
  Administered 2022-03-22: 4 ug via INTRAVENOUS

## 2022-03-22 MED ORDER — 0.9 % SODIUM CHLORIDE (POUR BTL) OPTIME
TOPICAL | Status: DC | PRN
  Administered 2022-03-22: 1000 mL

## 2022-03-22 MED ORDER — HYDROMORPHONE HCL 1 MG/ML IJ SOLN
0.2500 mg | INTRAMUSCULAR | Status: DC | PRN
  Administered 2022-03-22 (×2): 0.5 mg via INTRAVENOUS

## 2022-03-22 MED ORDER — ORAL CARE MOUTH RINSE: 15.0000 mL | Freq: Four times a day (QID) | OROMUCOSAL | Status: DC

## 2022-03-22 MED ORDER — OXYCODONE HCL 5 MG/5ML PO SOLN: 5.0000 mg | Freq: Once | ORAL | Status: DC | PRN

## 2022-03-22 MED ORDER — SUCCINYLCHOLINE 20MG/ML (10ML) SYRINGE FOR MEDFUSION PUMP - OPTIME
INTRAMUSCULAR | Status: DC | PRN
  Administered 2022-03-22: 120 mg via INTRAVENOUS

## 2022-03-22 MED ORDER — ONDANSETRON HCL 4 MG/2ML IJ SOLN: 4.0000 mg | Freq: Once | INTRAMUSCULAR | Status: DC | PRN

## 2022-03-22 MED ORDER — SODIUM CHLORIDE 0.9 % IV SOLN
3.0000 g | Freq: Four times a day (QID) | INTRAVENOUS | Status: DC
  Administered 2022-03-22 – 2022-03-24 (×6): 3 g via INTRAVENOUS
  Filled 2022-03-22 (×7): qty 8

## 2022-03-22 MED ORDER — CHLORHEXIDINE GLUCONATE CLOTH 2 % EX PADS: 6.0000 | MEDICATED_PAD | Freq: Once | CUTANEOUS | Status: DC

## 2022-03-22 MED ORDER — ALBUTEROL SULFATE (2.5 MG/3ML) 0.083% IN NEBU
2.5000 mg | INHALATION_SOLUTION | RESPIRATORY_TRACT | Status: DC | PRN
  Filled 2022-03-22: qty 3

## 2022-03-22 MED ORDER — ORAL CARE MOUTH RINSE: 15.0000 mL | Freq: Once | OROMUCOSAL | Status: AC

## 2022-03-22 MED ORDER — ACETAMINOPHEN 650 MG RE SUPP
650.0000 mg | Freq: Four times a day (QID) | RECTAL | Status: DC | PRN
  Filled 2022-03-22: qty 1

## 2022-03-22 MED ORDER — HYDROMORPHONE HCL 1 MG/ML IJ SOLN
INTRAMUSCULAR | Status: AC
  Filled 2022-03-22: qty 0.5

## 2022-03-22 MED ORDER — ONDANSETRON HCL 4 MG/2ML IJ SOLN
INTRAMUSCULAR | Status: AC
  Filled 2022-03-22: qty 2

## 2022-03-22 MED ORDER — ROCURONIUM BROMIDE 10 MG/ML (PF) SYRINGE
PREFILLED_SYRINGE | INTRAVENOUS | Status: AC
  Filled 2022-03-22: qty 10

## 2022-03-22 MED ORDER — PROPOFOL 10 MG/ML IV BOLUS
INTRAVENOUS | Status: AC
  Filled 2022-03-22: qty 20

## 2022-03-22 MED ORDER — HYDROCODONE-ACETAMINOPHEN 5-325 MG PO TABS
1.0000 | ORAL_TABLET | ORAL | Status: DC | PRN
  Administered 2022-03-22 – 2022-03-23 (×2): 2 via ORAL
  Administered 2022-03-23 – 2022-03-24 (×3): 1 via ORAL
  Filled 2022-03-22 (×2): qty 2
  Filled 2022-03-22: qty 1
  Filled 2022-03-22: qty 2
  Filled 2022-03-22: qty 1

## 2022-03-22 MED ORDER — KETAMINE HCL 50 MG/5ML IJ SOSY
PREFILLED_SYRINGE | INTRAMUSCULAR | Status: AC
  Filled 2022-03-22: qty 5

## 2022-03-22 MED ORDER — ROCURONIUM BROMIDE 10 MG/ML (PF) SYRINGE
PREFILLED_SYRINGE | INTRAVENOUS | Status: DC | PRN
Start: 2022-03-22 — End: 2022-03-22
  Administered 2022-03-22: 20 mg via INTRAVENOUS

## 2022-03-22 MED ORDER — IOHEXOL 300 MG/ML  SOLN
100.0000 mL | Freq: Once | INTRAMUSCULAR | Status: AC | PRN
  Administered 2022-03-22: 100 mL via INTRAVENOUS

## 2022-03-22 MED ORDER — HYDROCODONE-ACETAMINOPHEN 5-325 MG PO TABS
ORAL_TABLET | ORAL | Status: AC
  Administered 2022-03-23: 1 via ORAL
  Filled 2022-03-22: qty 2

## 2022-03-22 MED ORDER — ONDANSETRON HCL 4 MG PO TABS
4.0000 mg | ORAL_TABLET | Freq: Four times a day (QID) | ORAL | Status: DC | PRN
  Filled 2022-03-22: qty 1

## 2022-03-22 MED ORDER — SODIUM CHLORIDE 0.9 % IV SOLN
3.0000 g | Freq: Once | INTRAVENOUS | Status: AC
  Administered 2022-03-22: 3 g via INTRAVENOUS
  Filled 2022-03-22: qty 8

## 2022-03-22 MED ORDER — ONDANSETRON HCL 4 MG/2ML IJ SOLN
INTRAMUSCULAR | Status: DC | PRN
  Administered 2022-03-22: 4 mg via INTRAVENOUS

## 2022-03-22 MED ORDER — OXYCODONE HCL 5 MG PO TABS: 5.0000 mg | ORAL_TABLET | Freq: Once | ORAL | Status: DC | PRN

## 2022-03-22 MED ORDER — FENTANYL CITRATE (PF) 250 MCG/5ML IJ SOLN
INTRAMUSCULAR | Status: AC
  Filled 2022-03-22: qty 5

## 2022-03-22 MED ORDER — ONDANSETRON HCL 4 MG/2ML IJ SOLN
4.0000 mg | Freq: Once | INTRAMUSCULAR | Status: AC
  Administered 2022-03-22: 4 mg via INTRAVENOUS
  Filled 2022-03-22: qty 2

## 2022-03-22 MED ORDER — CHLORHEXIDINE GLUCONATE 0.12 % MT SOLN
15.0000 mL | Freq: Four times a day (QID) | OROMUCOSAL | Status: DC
  Administered 2022-03-22 – 2022-03-24 (×6): 15 mL via OROMUCOSAL
  Filled 2022-03-22 (×7): qty 15

## 2022-03-22 MED ORDER — DEXAMETHASONE SODIUM PHOSPHATE 10 MG/ML IJ SOLN
INTRAMUSCULAR | Status: DC | PRN
  Administered 2022-03-22: 10 mg via INTRAVENOUS

## 2022-03-22 SURGICAL SUPPLY — 38 items
BAG COUNTER SPONGE SURGICOUNT (BAG) IMPLANT
BAG SPNG CNTER NS LX DISP (BAG)
BLADE SURG 15 STRL LF DISP TIS (BLADE) ×1 IMPLANT
BLADE SURG 15 STRL SS (BLADE) ×2
BUR CROSS CUT FISSURE 1.6 (BURR) ×2 IMPLANT
CANISTER SUCT 3000ML PPV (MISCELLANEOUS) ×2 IMPLANT
COVER SURGICAL LIGHT HANDLE (MISCELLANEOUS) ×2 IMPLANT
DECANTER SPIKE VIAL GLASS SM (MISCELLANEOUS) ×2 IMPLANT
DRAPE U-SHAPE 76X120 STRL (DRAPES) ×2 IMPLANT
GAUZE PACKING FOLDED 2  STR (GAUZE/BANDAGES/DRESSINGS) ×2
GAUZE PACKING FOLDED 2 STR (GAUZE/BANDAGES/DRESSINGS) ×1 IMPLANT
GAUZE SPONGE 4X4 12PLY STRL LF (GAUZE/BANDAGES/DRESSINGS) ×2 IMPLANT
GLOVE BIO SURGEON STRL SZ8 (GLOVE) ×2 IMPLANT
GLOVE BIOGEL PI IND STRL 8 (GLOVE) IMPLANT
GLOVE BIOGEL PI INDICATOR 8 (GLOVE)
GOWN STRL REUS W/ TWL LRG LVL3 (GOWN DISPOSABLE) ×1 IMPLANT
GOWN STRL REUS W/ TWL XL LVL3 (GOWN DISPOSABLE) ×1 IMPLANT
GOWN STRL REUS W/TWL LRG LVL3 (GOWN DISPOSABLE) ×2
GOWN STRL REUS W/TWL XL LVL3 (GOWN DISPOSABLE) ×2
IV NS 1000ML (IV SOLUTION) ×2
IV NS 1000ML BAXH (IV SOLUTION) ×1 IMPLANT
KIT BASIN OR (CUSTOM PROCEDURE TRAY) ×2 IMPLANT
KIT TURNOVER KIT B (KITS) ×2 IMPLANT
NDL HYPO 25GX1X1/2 BEV (NEEDLE) ×2 IMPLANT
NEEDLE HYPO 25GX1X1/2 BEV (NEEDLE) ×4 IMPLANT
NS IRRIG 1000ML POUR BTL (IV SOLUTION) ×2 IMPLANT
PAD ARMBOARD 7.5X6 YLW CONV (MISCELLANEOUS) ×2 IMPLANT
SLEEVE IRRIGATION ELITE 7 (MISCELLANEOUS) ×2 IMPLANT
SPONGE SURGIFOAM ABS GEL 12-7 (HEMOSTASIS) IMPLANT
SUT CHROMIC 3 0 PS 2 (SUTURE) ×2 IMPLANT
SUT CHROMIC 4 0 PS 2 18 (SUTURE) ×1 IMPLANT
SUT ETHILON 3 0 PS 1 (SUTURE) ×1 IMPLANT
SYR BULB IRRIG 60ML STRL (SYRINGE) ×2 IMPLANT
SYR CONTROL 10ML LL (SYRINGE) ×2 IMPLANT
TRAY ENT MC OR (CUSTOM PROCEDURE TRAY) ×2 IMPLANT
TUBE SALEM SUMP 16 FR W/ARV (TUBING) ×2 IMPLANT
TUBING IRRIGATION (MISCELLANEOUS) ×2 IMPLANT
YANKAUER SUCT BULB TIP NO VENT (SUCTIONS) ×2 IMPLANT

## 2022-03-22 NOTE — Op Note (Signed)
PRE-OPERATIVE DIAGNOSIS:  Odontogenic infection ?POST-OPERATIVE DIAGNOSIS:  Same  ?  ?SURGEON:  Surgeon(s) and Role: ?   Conley Simmonds, Gareth Morgan, DMD - Primary ?  ?Procedure: ?Surgical removal #20, 23-25  ?Removal of full bony impacted #17 ?Excision of cystic lesion associated with #17 ?I&D of buccal and masticator spaces ?  ?Description of Operation/Procedure: ?  ?The patient was encountered in [facility] 11.  General anesthesia was induced and an oral ETT was secured in the standard fashion however even after induction MIO remain only 10-15 mm due to infection involving the submasseteric space. I assisted opening the mouth with a molt mouth prop and patient was able to be intubated. After mouth was opened wide enough for clinical exam it was found teeth #20 and 23-25 were grossly mobile and needed extraction. Further a restoration on tooth #9 chipped during forced opening with the Molt Mouth prop though this was necessary to secure the airway.  ? ?The table was moved slightly away from anesthesia and the patient was properly padded, relieving all pressure points.  A formal time-out was executed.  2% lidocaine with 1:100,000 epinephrine was infiltrated into the proposed surgical sites.  The patient was prepped and draped in the standard sterile fashion and a throat pack was placed.              ?  ? ?Attention was then directed intraorally to the lower left.  A full thickness mucoperiosteal flap was elevated on both the buccal and lingual.  Teeth #20, 23-25 were then removed with standard elevator and forceps technique without complication. Dissection ensued deeper on the mandible into the sub-masseteric and buccal spaces, yielding purulence.  This was sent for aerobic and anaerobic cultures and a stat Gram stain.  Bone was then removed to uncover tooth #17 which was sectioned and removed in multiple pieces (complicated extraction). The extraction site was thoroughly curetted, bone was smoothed, and the site  thoroughly irrigated.   The cystic lesion/follicle associated with #17 was removed and submitted for histopathology. ?  ?Intraoral access was used to assure proper 1/4'? Penrose drain placement into the submasseteric space.  A total of 1 drains was placed and secured with 3-0 nylon sutures in the standard fashion.  All areas were thoroughly irrigated.  The full thickness mucoperiosteal flap was reapproximated with 3-0 chromic gut suture.   ?  ?The oral cavity was suctioned free of all debris and secretions.  The teeth were brushed. The throat pack was removed and an orogastric tube was passed to evacuate the stomach contents.  Sponge and needle counts were correct x 2.  Care of the patient was turned over to the Anesthesia team for uneventful extubation and delivery of the patient to the PACU in stable condition. ?  ?ANESTHESIA:   general ?EBL:  50 mL  ?DRAINS: Penrose drain intraorally  ?LOCAL MEDICATIONS USED:  LIDOCAINE 2% w/ 1:100000 epi ?SPECIMEN:  Aspirate - cultures for anaerobes, aerobes, and fungal ?PLAN OF CARE: Return to floor ?PATIENT DISPOSITION:  PACU - hemodynamically stable. ?Delay start of Pharmacological VTE agent (>24hrs) due to surgical blood loss or risk of bleeding: no ?  ?OMFS Recommendations ?-Please admit to medicine, I will follow while inpatient ?-Liquid diet today, advance to soft mechanical diet tomorrow ?-Peridex (chlorhexidine) mouthrnise QID ?-Change Kerlix dressing as needed ?-Continue Unasyn q6hr ?-Follow cultures ?-Daily CBC w/ diff  ? ?Terese Door, DDS ?Oral and Maxillofacial Surgeon ?[facility] ?Office # [phone] ?Cell # [phone] ? ?

## 2022-03-22 NOTE — ED Provider Notes (Signed)
Assumed care of patient from Dr. Dina Rich at 7 AM.  Patient with worsening facial swelling, pain and CT is pending.  Patient is noted to have a mild leukocytosis of 13.8.  I independently visualized and interpreted patient's CT that shows significant swelling and mass on the left side of her face.  Radiology reports marked heterogeneous enlargement of the lower left masticator space and left masseter muscle which appears to be related to sequela of impacted left mandible wisdom tooth with a cyst which has eroded from the posterior mandible body and a 4 to 5 cm expansion of the muscle there is heterogeneous hyperdense tissue tumor versus complicated cyst or abscess.  She also has reactive appearing lymph nodes.  On exam patient has no stridor or voice changes but does have trismus.  There is no tenderness on the anterior neck.  No airway compromise at this time.  Will discuss with oral surgery for evaluation and further recommendations of care.  Will give patient IV antibiotics. ?Pt given Unasyn.  NPO and plans for the OR later today with Dr. Conley Simmonds. ?  Blanchie Dessert, MD ?03/22/22 [id number] ? ?

## 2022-03-22 NOTE — ED Triage Notes (Signed)
Pt here with significant L side facial swelling. Pt reports she had some back teeth pulled 2-3 weeks ago and started having swelling after, states its been getting worse. Pt's L eye is swollen and has had vision changes, pt reports L ear pain, difficulty swallowing. Pt's L cheek is rigid and hot to touch. ?

## 2022-03-22 NOTE — Transfer of Care (Signed)
Immediate Anesthesia Transfer of Care Note ? ?Patient: Robin Richards ? ?Procedure(s) Performed: INCISION AND DRAINAGE FACIAL ABSCESS WITH EXTRACTION OF TEETH ?DENTAL RESTORATION/EXTRACTIONS OF 17, 20, 23, 24, 25 ? ?Patient Location: PACU ? ?Anesthesia Type:General ? ?Level of Consciousness: awake, alert , oriented and drowsy ? ?Airway & Oxygen Therapy: Patient Spontanous Breathing and Patient connected to face mask oxygen ? ?Post-op Assessment: Report given to RN, Post -op Vital signs reviewed and stable and Patient moving all extremities X 4 ? ?Post vital signs: Reviewed and stable ? ?Last Vitals:  ?Vitals Value Taken Time  ?BP 139/93 03/22/22 1559  ?Temp    ?Pulse 93 03/22/22 1600  ?Resp 21 03/22/22 1600  ?SpO2 95 % 03/22/22 1600  ?Vitals shown include unvalidated device data. ? ?Last Pain:  ?Vitals:  ? 03/22/22 1306  ?TempSrc:   ?PainSc: 5   ?   ? ?Patients Stated Pain Goal: 2 (03/22/22 1306) ? ?Complications: No notable events documented. ?

## 2022-03-22 NOTE — H&P (Addendum)
[facility] History and Physical  Robin Richards ZOX:[id number] DOB: Feb 14, 1976 DOA: 03/22/2022   PCP: Raymon Mutton., FNP  Specialists: None  Chief Complaint: Left-sided facial pain and swelling  HPI: Robin Richards is a 46 y.o. female with a past medical history of asthma, hyperlipidemia presented to the emergency department earlier this morning with complaints of left-sided facial swelling and pain.  She apparently had some teeth pulled about 2 to 3 weeks prior to the presentation.  Had some swelling then but the swelling has been getting worse.  Patient's left eye was also noted to be swollen.  Patient was evaluated in the ED.  Underwent CT maxillofacial study which suggested infection of the stump on the left.  No discrete abscess was noted.  General surgery was consulted.  Patient was taken to the OR.  We were called by oral surgeon to admit the patient to the hospital for continued IV antibiotics. Patient was seen in the PACU.  Still under the effect of anesthesia and so history is limited.  Plus she had dressing in her mouth which made it difficult for her to communicate. States that her pain is slightly better.  Denies any chest pain shortness of breath nausea or vomiting.  No fever or chills recently.  Denies drooling of swallow.  Denies any wheezing.   Home Medications: Prior to Admission medications   Medication Sig Start Date End Date Taking? Authorizing Provider  acetaminophen (TYLENOL) 500 MG tablet Take 500 mg by mouth every 6 (six) hours as needed for mild pain.   Yes [provider]  albuterol (PROVENTIL HFA;VENTOLIN HFA) 108 (90 Base) MCG/ACT inhaler Inhale 1-2 puffs into the lungs every 6 (six) hours as needed for wheezing or shortness of breath. 01/28/18  Yes Henson, Vickie L, NP-C  atorvastatin (LIPITOR) 20 MG tablet Take 20 mg by mouth daily. 03/17/22  Yes [provider]  ibuprofen (ADVIL,MOTRIN) 600 MG tablet TAKE 1 TABLET BY MOUTH EVERY 6 HOURS  AS NEEDED FOR FEVER,HEADACHE,OR MILD PAIN Patient taking differently: Take 600 mg by mouth every 6 (six) hours as needed for mild pain. 03/07/18  Yes Henson, Vickie L, NP-C  amoxicillin (AMOXIL) 500 MG capsule SMARTSIG:1 Tablet(s) By Mouth Every 12 Hours Patient not taking: Reported on 03/22/2022 11/29/21   [provider]  cyclobenzaprine (FLEXERIL) 10 MG tablet Take 1 tablet (10 mg total) by mouth 3 (three) times daily as needed for muscle spasms. Patient not taking: Reported on 03/22/2022 09/12/17   Hetty Blend L, NP-C  diclofenac (VOLTAREN) 75 MG EC tablet Take 1 tablet (75 mg total) by mouth 2 (two) times daily. Patient not taking: Reported on 03/22/2022 01/02/18   Hetty Blend L, NP-C  fluticasone (FLONASE) 50 MCG/ACT nasal spray Place 2 sprays into both nostrils daily. Patient not taking: Reported on 03/22/2022 01/28/18   Hetty Blend L, NP-C  fluticasone furoate-vilanterol (BREO ELLIPTA) 200-25 MCG/INH AEPB Inhale 1 puff into the lungs daily. Patient not taking: Reported on 03/22/2022 01/07/18   Avanell Shackleton, NP-C    Allergies: No Known Allergies  Past Medical History: Past Medical History:  Diagnosis Date   Arthritis    Asthma    Elevated hemoglobin A1c    Heartburn    Scoliosis     Past Surgical History:  Procedure Laterality Date   SPINE SURGERY      Social History: Denies smoking alcohol use illicit drug use.  Usually independent with daily activities.   Family History:  Family History  Problem Relation Age of Onset   Cirrhosis Mother    Cancer Father    Hypertension Sister    Thyroid disease Sister    Breast cancer Maternal Grandmother      Review of Systems - History obtained from the patient General ROS: positive for  - fatigue Psychological ROS: negative Ophthalmic ROS: Swelling of the left eye ENT ROS: As in HPI Allergy and Immunology ROS: negative Hematological and Lymphatic ROS: negative Endocrine ROS: negative Respiratory ROS: no cough,  shortness of breath, or wheezing Cardiovascular ROS: no chest pain or dyspnea on exertion Gastrointestinal ROS: no abdominal pain, change in bowel habits, or black or bloody stools Genito-Urinary ROS: no dysuria, trouble voiding, or hematuria Musculoskeletal ROS: negative Neurological ROS: no TIA or stroke symptoms Dermatological ROS: negative  Physical Examination  Vitals:   03/22/22 1700 03/22/22 1715 03/22/22 1730 03/22/22 1745  BP:  133/78 140/84 128/74  Pulse: 77 75 86 90  Resp: 14 13 20 19   Temp:      TempSrc:      SpO2: 96% 96% 97% 93%  Weight:      Height:        BP 128/74 (BP Location: Left Arm)   Pulse 90   Temp 98.6 F (37 C)   Resp 19   Ht 5\' 2"  (1.575 m)   Wt 61.2 kg   LMP 02/19/2022 (Exact Date)   SpO2 93%   BMI 24.69 kg/m   General appearance: alert, cooperative, appears stated age, and no distress Head: Normocephalic, without obvious abnormality, atraumatic Eyes: Left eye is noted to be swollen alongside the rest of the face on the left. Warmth is noted. No clear erythema. Throat: Difficult examination as she is recently status post surgery and has dressing in her mouth. Neck: no adenopathy, no carotid bruit, no JVD, supple, symmetrical, trachea midline, and thyroid not enlarged, symmetric, no tenderness/mass/nodules Resp: clear to auscultation bilaterally Cardio: regular rate and rhythm, S1, S2 normal, no murmur, click, rub or gallop GI: soft, non-tender; bowel sounds normal; no masses,  no organomegaly Extremities: extremities normal, atraumatic, no cyanosis or edema Pulses: 2+ and symmetric Skin: Skin color, texture, turgor normal. No rashes or lesions Lymph nodes: Cervical, supraclavicular, and axillary nodes normal. Neurologic: No obvious focal neurological deficits noted.   Labs on Admission: I have personally reviewed following labs and imaging studies  CBC: Recent Labs  Lab 03/22/22 0550  WBC 13.8*  NEUTROABS 9.8*  HGB 13.3  HCT 44.2   MCV 73.4*  PLT 223   Basic Metabolic Panel: Recent Labs  Lab 03/22/22 0550  NA 136  K 4.1  CL 107  CO2 21*  GLUCOSE 180*  BUN 12  CREATININE 0.59  CALCIUM 9.9   GFR: Estimated Creatinine Clearance: 74.8 mL/min (by C-G formula based on SCr of 0.59 mg/dL). Liver Function Tests: Recent Labs  Lab 03/22/22 0550  AST 12*  ALT 14  ALKPHOS 130*  BILITOT 0.9  PROT 6.8  ALBUMIN 3.7     Radiological Exams on Admission: DG Orthopantogram  Result Date: 03/22/2022 CLINICAL DATA:  Left-sided facial swelling. Recent tooth extractions. EXAM: ORTHOPANTOGRAM/PANORAMIC COMPARISON:  CT maxillofacial, same date. FINDINGS: As demonstrated on the CT scan there is lucency surrounding and impacted left mandibular wisdom tooth with cortical erosion. The associated soft tissue masses better demonstrated on the CT scan. There is also an impacted right mandibular wisdom tooth but no surrounding lucency. The other remaining teeth appear to be intact. IMPRESSION: 1. Impacted left mandibular  wisdom tooth with surrounding lucency and cortical destruction better demonstrated on the CT scan. 2. Impacted right mandibular wisdom tooth without surrounding lucency. 3. The other teeth are intact. Electronically Signed   By: Rudie Meyer M.D.   On: 03/22/2022 10:08   CT Maxillofacial W Contrast  Result Date: 03/22/2022 CLINICAL DATA:  46 year old female with possible facial abscess. EXAM: CT MAXILLOFACIAL WITH CONTRAST TECHNIQUE: Multidetector CT imaging of the maxillofacial structures was performed with intravenous contrast. Multiplanar CT image reconstructions were also generated. RADIATION DOSE REDUCTION: This exam was performed according to the departmental dose-optimization program which includes automated exposure control, adjustment of the mA and/or kV according to patient size and/or use of iterative reconstruction technique. CONTRAST:  OMNIPAQUE IOHEXOL 300 MG/ML  SOLN COMPARISON:  None. FINDINGS:  Osseous: Un-erupted mandible wisdom teeth, with a left-side associated dentigerous cyst (series 10, image 57). But there is associated broad-based dehiscence of bone in an area of 5 mm at the lateral mandible body there (series 5, image 37) and diffusely enlarged and heterogeneous left masticator space in that region. Linear relatively hyperdense material extends through the bony defect in the masticator space on series 7, image 55, and associated masseter muscle enlargement in an area of 45 x 27 x 48 mm (AP by transverse by CC) is mostly intermediate to high density. There is a small 14 mm internal component of lower density (series 7, image 52) but this does not reach simple fluid density. Associated left facial swelling and subcutaneous stranding, mass effect on the left parotid space without evidence of invasion outside of the masticator space. No soft tissue gas. Other dental periapical lucency especially bilateral anterior mandible bicuspids. Left maxillary medial incisor root canal changes. No other No acute osseous abnormality identified. Partially visible bulky anterior cervical spine endplate osteophytes. Orbits: Intact orbital walls. Orbits soft tissues appears symmetric and within normal limits. Sinuses: Only mild scattered paranasal sinus mucosal thickening. Tympanic cavities and mastoids are aerated. Soft tissues: Abnormal left masticator space and overlying facial soft tissues as above. Superimposed visible thyroid, larynx, pharynx, retropharyngeal space, sublingual space, right parapharyngeal, right submandibular, and right parotid spaces are within normal limits. There is mild inflammation in the left parapharyngeal space. And there is thickening of the left platysma with edema tracking superficial to the left submandibular space. There are small but reactive appearing left level 1B and level 2 lymph nodes up to 8-9 mm short axis (series 3, image 19). The major vascular structures in the neck and at  the skull base remain patent with dominant right IJ. Dominant right vertebral artery. Limited intracranial: Negative. IMPRESSION: Marked heterogeneous enlargement of the lower left masticator space and left masseter muscle appears related to sequelae of impacted left mandible wisdom tooth dentigerous cyst which has eroded out from the posterior mandible body. The 4-5 cm expansion of the muscle is heterogeneous and mostly hyperdense, more resembling soft tissue tumor than a complicated cyst or abscess. But there is regional soft tissue inflammation in both the subcutaneous and left parapharyngeal spaces, and regional reactive appearing lymph nodes. Recommend OMFS consultation. Electronically Signed   By: Odessa Fleming M.D.   On: 03/22/2022 07:29       Problem List  Principal Problem:   Dental infection Active Problems:   Asthma, moderate persistent   Facial cellulitis   Assessment: This is a 46 year old African-American female with past medical history as stated earlier who comes in with left-sided facial swelling which has been progressively worsening for several days.  Found to have severe infection in her wisdom tooth.  She is now status post surgical removal of teeth.  There was also a cystic lesion that was noted which was also removed.  Plan: #1 Dental infection with component of facial cellulitis: She is status post dental extraction.  There was also a cyst that was found which was also removed.  WBC was noted to be elevated at 13.8.  Lactic acid level was normal.  She needs continued IV antibiotics.  She will be continued on Unasyn.  Check labs tomorrow.  #2 History of asthma: Stable.  Does not appear that she is compliant with her home inhaler regimen.  #3 Hyperlipidemia: Continue with statin when she is able to take by mouth.    DVT Prophylaxis: SCDs for now.  May switch to heparin products tomorrow Code Status: Full code Family Communication: Discussed with patient.  No family at  bedside Disposition: Home when improved Consults called: Dr. Ross Marcus with dental medicine Admission Status: Status is: Observation The patient remains OBS appropriate and will d/c before 2 midnights.    Severity of Illness: The appropriate patient status for this patient is OBSERVATION. Observation status is judged to be reasonable and necessary in order to provide the required intensity of service to ensure the patient's safety. The patient's presenting symptoms, physical exam findings, and initial radiographic and laboratory data in the context of their medical condition is felt to place them at decreased risk for further clinical deterioration. Furthermore, it is anticipated that the patient will be medically stable for discharge from the hospital within 2 midnights of admission.    Further management decisions will depend on results of further testing and patient's response to treatment.   Fredrick Dray [employer]  [facility] [employer] on [email or web address]  03/22/2022, 6:03 PM

## 2022-03-22 NOTE — ED Notes (Signed)
Patient transported to CT 

## 2022-03-22 NOTE — ED Provider Notes (Signed)
?[facility] ?Provider Note ? ? ?CSN: [id number] ?Arrival date & time: 03/22/22  0507 ? ?  ? ?History ? ?Chief Complaint  ?Patient presents with  ? Facial Swelling  ? ? ?Robin Richards is a 46 y.o. female. ? ?HPI ? ?  ? ?This a 46 year old female who presents with left-sided facial swelling.  Patient reports that she has had worsening left facial swelling since waking up this morning.  She is a very poor historian.  When asked when her facial swelling started.  She initially said several days ago but then states that she had had some swelling in the jaw of varying degrees over the last couple of weeks.  Triage note indicated that she had teeth pulled 2 to 3 weeks ago; however, she states that she has not had any teeth pulled this year.  She states that her left eye swelling and left ear pain are new today.  She also reports double vision out of the left eye.  She has not had any fevers.  She does report some difficulty swallowing. ? ?Home Medications ?Prior to Admission medications   ?Medication Sig Start Date End Date Taking? Authorizing Provider  ?albuterol (PROVENTIL HFA;VENTOLIN HFA) 108 (90 Base) MCG/ACT inhaler Inhale 1-2 puffs into the lungs every 6 (six) hours as needed for wheezing or shortness of breath. 01/28/18   Girtha Rm, NP-C  ?cyclobenzaprine (FLEXERIL) 10 MG tablet Take 1 tablet (10 mg total) by mouth 3 (three) times daily as needed for muscle spasms. 09/12/17   Henson, Vickie L, NP-C  ?diclofenac (VOLTAREN) 75 MG EC tablet Take 1 tablet (75 mg total) by mouth 2 (two) times daily. 01/02/18   Henson, Vickie L, NP-C  ?fluticasone (FLONASE) 50 MCG/ACT nasal spray Place 2 sprays into both nostrils daily. 01/28/18   Henson, Vickie L, NP-C  ?fluticasone furoate-vilanterol (BREO ELLIPTA) 200-25 MCG/INH AEPB Inhale 1 puff into the lungs daily. 01/07/18   Henson, Vickie L, NP-C  ?ibuprofen (ADVIL,MOTRIN) 600 MG tablet TAKE 1 TABLET BY MOUTH EVERY 6 HOURS AS NEEDED FOR  FEVER,HEADACHE,OR MILD PAIN 03/07/18   Harland Dingwall L, NP-C  ?   ? ?Allergies    ?Patient has no known allergies.   ? ?Review of Systems   ?Review of Systems  ?Constitutional:  Negative for fever.  ?HENT:  Positive for facial swelling and trouble swallowing. Negative for sore throat.   ?Respiratory:  Negative for shortness of breath.   ?Cardiovascular:  Negative for chest pain.  ?Gastrointestinal:  Negative for abdominal pain.  ?Genitourinary:  Negative for dysuria.  ?All other systems reviewed and are negative. ? ?Physical Exam ?Updated Vital Signs ?BP (!) 121/91   Pulse 74   Temp 97.9 ?F (36.6 ?C) (Oral)   Resp 16   SpO2 98%  ?Physical Exam ?Vitals and nursing note reviewed.  ?Constitutional:   ?   Appearance: She is well-developed. She is not ill-appearing.  ?HENT:  ?   Head:  ?   Comments: Significant swelling over the left mandible and parotid region, hard to touch, nonfluctuant, nonerythematous, trismus noted, palpable mass within the left cheek and encasing the left mandible, no significant lymphadenopathy, airway intact ?Eyes:  ?   Pupils: Pupils are equal, round, and reactive to light.  ?   Comments: Slight swelling noted of the left eyelid  ?Cardiovascular:  ?   Rate and Rhythm: Normal rate and regular rhythm.  ?   Heart sounds: Normal heart sounds.  ?Pulmonary:  ?  Effort: Pulmonary effort is normal. No respiratory distress.  ?   Breath sounds: No wheezing.  ?Abdominal:  ?   Palpations: Abdomen is soft.  ?   Tenderness: There is no abdominal tenderness.  ?Musculoskeletal:  ?   Cervical back: Neck supple.  ?Skin: ?   General: Skin is warm and dry.  ?Neurological:  ?   Mental Status: She is alert and oriented to person, place, and time.  ?Psychiatric:     ?   Mood and Affect: Mood normal.  ? ? ?ED Results / Procedures / Treatments   ?Labs ?(all labs ordered are listed, but only abnormal results are displayed) ?Labs Reviewed  ?COMPREHENSIVE METABOLIC PANEL - Abnormal; Notable for the following  components:  ?    Result Value  ? CO2 21 (*)   ? Glucose, Bld 180 (*)   ? AST 12 (*)   ? Alkaline Phosphatase 130 (*)   ? All other components within normal limits  ?CBC WITH DIFFERENTIAL/PLATELET - Abnormal; Notable for the following components:  ? WBC 13.8 (*)   ? RBC 6.02 (*)   ? MCV 73.4 (*)   ? MCH 22.1 (*)   ? Neutro Abs 9.8 (*)   ? Monocytes Absolute 1.5 (*)   ? All other components within normal limits  ?LACTIC ACID, PLASMA  ?LACTIC ACID, PLASMA  ?URINALYSIS, ROUTINE W REFLEX MICROSCOPIC  ?I-STAT BETA HCG BLOOD, ED ([facility], [facility], [facility] ONLY)  ? ? ?EKG ?None ? ?Radiology ?No results found. ? ?Procedures ?Procedures  ? ? ?Medications Ordered in ED ?Medications  ?morphine (PF) 4 MG/ML injection 4 mg (4 mg Intravenous Given 03/22/22 0619)  ?ondansetron [facility]) injection 4 mg (4 mg Intravenous Given 03/22/22 0618)  ? ? ?ED Course/ Medical Decision Making/ A&P ?  ?                        ?Medical Decision Making ?Amount and/or Complexity of Data Reviewed ?Labs: ordered. ?Radiology: ordered. ? ?Risk ?Prescription drug management. ? ? ?This patient presents to the ED for concern of facial swelling, this involves an extensive number of treatment options, and is a complaint that carries with it a high risk of complications and morbidity.  The differential diagnosis includes abscess, infection, mass or lymphoma  ? ?MDM:   ? ?Patient presents with swelling of the left face and jaw.  She is nontoxic.  Vital signs are reassuring.  ABCs are intact.  She does have trismus.  Swelling is located mostly in the cheek and the left mandible.  It avoids the left neck.  Less likely Ludwick's.  Mass appears solid and less fluctuant.  Favor solid mass over infection.  Unclear whether she is truly had any recent dental work.  She has generally poor dentition.  Labs obtained.  Leukocytosis to 13.  Otherwise labs are fairly unremarkable.  We will obtain a CT scan. ?(Labs, imaging) ? ?Labs: ?I Ordered, and personally interpreted labs.  The  pertinent results include: Leukocytosis to 13. ? ?Imaging Studies ordered: ?I ordered imaging studies including CT pending ?I independently visualized and interpreted imaging. ?I agree with the radiologist interpretation ? ?Additional history obtained from chart review.  External records from outside source obtained and reviewed including ENT visit from May 2022.  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  No mass noted on notes ? ?Critical Interventions: ?IV morphine, Zofran ? ?Consultations: ?I requested consultation with the NA,  and discussed lab and  imaging findings as well as pertinent plan - they recommend: N/A ? ?Cardiac Monitoring: ?The patient was maintained on a cardiac monitor.  I personally viewed and interpreted the cardiac monitored which showed an underlying rhythm of: Normal sinus rhythm ? ?Reevaluation: ?After the interventions noted above, I reevaluated the patient and found that they have :stayed the same ? ? ?Considered admission for: Pending ? ?Social Determinants of Health: ?Lives independently, former smoker ? ?Disposition: Pending ? ?Co morbidities that complicate the patient evaluation ? ?Past Medical History:  ?Diagnosis Date  ? Arthritis   ? Asthma   ? Elevated hemoglobin A1c   ? Heartburn   ? Scoliosis   ?  ? ?Medicines ?Meds ordered this encounter  ?Medications  ? morphine (PF) 4 MG/ML injection 4 mg  ? ondansetron (ZOFRAN) injection 4 mg  ?  ?I have reviewed the patients home medicines and have made adjustments as needed ? ?Problem List / ED Course: ?Problem List Items Addressed This Visit   ?None ?  ? ? ? ? ? ? ? ? ? ? ? ? ?Final Clinical Impression(s) / ED Diagnoses ?Final diagnoses:  ?None  ? ? ?Rx / DC Orders ?ED Discharge Orders   ? ? None  ? ?  ? ? ?  ?Merryl Hacker, MD ?03/22/22 [phone] ? ?

## 2022-03-22 NOTE — Anesthesia Procedure Notes (Signed)
Procedure Name: Intubation ?Date/Time: 03/22/2022 2:31 PM ?Performed by: Rande Brunt, CRNA ?Pre-anesthesia Checklist: Patient identified, Emergency Drugs available, Suction available and Patient being monitored ?Patient Re-evaluated:Patient Re-evaluated prior to induction ?Oxygen Delivery Method: Circle System Utilized ?Preoxygenation: Pre-oxygenation with 100% oxygen ?Induction Type: IV induction ?Ventilation: Mask ventilation without difficulty ?Laryngoscope Size: Glidescope and 4 (Attempted to intubate with MAC 3, grade 3b view due to minimal mouth opening. Intubated with Glidescope 4 with grade 1 view) ?Grade View: Grade I ?Tube type: Oral ?Tube size: 7.0 mm ?Number of attempts: 1 ?Airway Equipment and Method: Stylet and Oral airway ?Placement Confirmation: ETT inserted through vocal cords under direct vision, positive ETCO2 and breath sounds checked- equal and bilateral ?Secured at: 22 cm ?Tube secured with: Tape ?Dental Injury: Dental damage  ?Difficulty Due To: Difficulty was anticipated, Difficult Airway- due to limited oral opening and Difficult Airway- due to dentition ?Comments: Patient with limited mouth opening and with swelling. Dr. Conley Simmonds placed mouth prop after induction meds to attempt to help with opening of the mouth, however still with minimal mouth opening. During placement of mouth prop front L tooth chipped. Attempted to intubate with MAC 3 blade. Grade 3b view and not able to place ETT. Glidescope 4 blade used with grade 1 view and taped at 22 at the lip.  ? ? ? ? ?

## 2022-03-22 NOTE — H&P (Signed)
H&P Infection ? ?Exam Date: 03/22/22 ? ?ID: The patient is a 57 yoF who presented with pain and swelling of the left masticator space. ? ?History of Present Illness:  The patient reports having pain and swelling that began a few days ago and has gotten progressively worse.  The patient was consulted by ED for evaluation.  The patient reports trismus, denies dyspnea, dysphagia, or vision changes.  The patient has been started on Unasyn. ? ?The patient's last PO intake was at approximately yesterday afternoon.  ?  ?Clinical Exam: ?Extraoral Exam:  ?            Patient is alert, orientated and in mild distress ?            CN II-XII grossly intact ?            There is appreciable facial swelling on the left side of the face.  ?            The swelling extends into the periorbital region  ?  ?Intraoral Exam:  ?            The patient does have trismus with maximum incisal opening of 15 mm. ?            The buccal vestibule is raised. ?            The floor of mouth is soft, non-elevated ?            The tongue is not elevated. ?            There is not lateral pharyngeal swelling with the uvula midline ?            There is no palatal draping present. ?            Oral airway is patent  ?  ?            Cardiovascular:  Regular rate and rhythm without any appreciable murmurs, gallops or rubs.   ?            Respiratory: Lungs were clear to auscultation bilaterally without any wheezing or rhonchi. ?            Abdomen: Non-distended  ?  ?Radiographic Exam:   ?            Panorex shows impacted #17 and 32 with cystic lesion associated with #17. ?             ?            A CT of the larynx with contrast was obtained showing fluid collections/cellulitis associated with the left sub-masseteric space. There is minimal airway deviation.  ?  ?Assessment: 47 yoF patient with a impacted #17 with cortical erosion and resulting left masticator space infection.  ?  ?Plan:  The patient will require incision and drainage of the  infection with extraction of teeth as necessary, including #17, in the OR.  Consent was obtained and will be scanned into EPIC. ?  ?Risks, complications and alternatives of tooth extraction and incision and drainage procedures were discussed and questions were answered.  Among all potential risks and complications, I emphasized the potential for pain, bleeding, swelling, infection, localized alveolar osteitis (dry socket), temporary and permanent lingual and inferior alveolar nerve injury, oroantral (sinus) communication, oronasal communication, jaw fracture, damage to adjacent teeth and tissue, joint discomfort, bone/tooth fragments, recurrence of infection, need for additional procedures, facial nerve injury, scarring, limited mouth opening,  drain placement, aspiration and anesthetic mishap. ?  ?Terese Door, DDS ?Oral and Maxillofacial Surgeon ?[facility] ([place]) ?Office # [phone] ?Cell # [phone] ? ?

## 2022-03-22 NOTE — Anesthesia Postprocedure Evaluation (Signed)
Anesthesia Post Note ? ?Patient: Robin Richards ? ?Procedure(s) Performed: INCISION AND DRAINAGE FACIAL ABSCESS WITH EXTRACTION OF TEETH ?DENTAL RESTORATION/EXTRACTIONS OF 17, 20, 23, 24, 25 ? ?  ? ?Patient location during evaluation: PACU ?Anesthesia Type: General ?Level of consciousness: awake and alert ?Pain management: pain level controlled ?Vital Signs Assessment: post-procedure vital signs reviewed and stable ?Respiratory status: spontaneous breathing, nonlabored ventilation, respiratory function stable and patient connected to nasal cannula oxygen ?Cardiovascular status: blood pressure returned to baseline and stable ?Postop Assessment: no apparent nausea or vomiting ?Anesthetic complications: no ? ? ?No notable events documented. ? ?Last Vitals:  ?Vitals:  ? 03/22/22 1600 03/22/22 1615  ?BP: (!) 139/93 139/78  ?Pulse: 93 90  ?Resp: (!) 21 18  ?Temp:    ?SpO2: 95% 99%  ?  ?Last Pain:  ?Vitals:  ? 03/22/22 1306  ?TempSrc:   ?PainSc: 5   ? ? ?  ?  ?  ?  ?  ?  ? ?[name] S ? ? ? ? ?

## 2022-03-22 NOTE — H&P (Signed)
Anesthesia H&P Update: History and Physical Exam reviewed; patient is OK for planned anesthetic and procedure. ? ?

## 2022-03-22 NOTE — Anesthesia Preprocedure Evaluation (Addendum)
Anesthesia Evaluation  ?Patient identified by MRN, date of birth, ID band ?Patient awake ? ? ? ?Reviewed: ?Allergy & Precautions, NPO status , Patient's Chart, lab work & pertinent test results ? ?Airway ?Mallampati: IV ? ?TM Distance: >3 FB ?Neck ROM: Full ? ? ? Dental ? ?(+) Poor Dentition, Missing, Dental Advisory Given ?  ?Pulmonary ?asthma , Current Smoker and Patient abstained from smoking.,  ?  ?Pulmonary exam normal ?breath sounds clear to auscultation ? ? ? ? ? ? Cardiovascular ?negative cardio ROS ?Normal cardiovascular exam ?Rhythm:Regular Rate:Normal ? ? ?  ?Neuro/Psych ?negative neurological ROS ? negative psych ROS  ? GI/Hepatic ?Neg liver ROS, GERD  Medicated,  ?Endo/Other  ?hyperlipidemia ? Renal/GU ?negative Renal ROS  ?negative genitourinary ?  ?Musculoskeletal ? ?(+) Arthritis , Facial abscess ?Dental caries with abscess  ? Abdominal ?  ?Peds ? Hematology ?negative hematology ROS ?(+)   ?Anesthesia Other Findings ? ? Reproductive/Obstetrics ? ?  ? ? ? ? ? ? ? ? ? ? ? ? ? ?  ?  ? ? ? ? ? ? ? ? ?Anesthesia Physical ?Anesthesia Plan ? ?ASA: 2 ? ?Anesthesia Plan: General  ? ?Post-op Pain Management: Ofirmev IV (intra-op)*, Precedex, Ketamine IV* and Dilaudid IV  ? ?Induction: Intravenous ? ?PONV Risk Score and Plan: 3 and Treatment may vary due to age or medical condition, Midazolam, Ondansetron and Dexamethasone ? ?Airway Management Planned: Nasal ETT ? ?Additional Equipment: None ? ?Intra-op Plan:  ? ?Post-operative Plan: Extubation in OR ? ?Informed Consent: I have reviewed the patients History and Physical, chart, labs and discussed the procedure including the risks, benefits and alternatives for the proposed anesthesia with the patient or authorized representative who has indicated his/her understanding and acceptance.  ? ? ? ?Dental advisory given ? ?Plan Discussed with: CRNA and Anesthesiologist ? ?Anesthesia Plan Comments:   ? ? ? ? ? ? ?Anesthesia Quick  Evaluation ? ?

## 2022-03-23 ENCOUNTER — Encounter (HOSPITAL_COMMUNITY): Payer: Self-pay | Admitting: Oral Surgery

## 2022-03-23 DIAGNOSIS — E785 Hyperlipidemia, unspecified: Secondary | ICD-10-CM | POA: Diagnosis present

## 2022-03-23 DIAGNOSIS — Z87891 Personal history of nicotine dependence: Secondary | ICD-10-CM | POA: Diagnosis not present

## 2022-03-23 DIAGNOSIS — Z7951 Long term (current) use of inhaled steroids: Secondary | ICD-10-CM | POA: Diagnosis not present

## 2022-03-23 DIAGNOSIS — J4540 Moderate persistent asthma, uncomplicated: Secondary | ICD-10-CM | POA: Diagnosis not present

## 2022-03-23 DIAGNOSIS — Z79899 Other long term (current) drug therapy: Secondary | ICD-10-CM | POA: Diagnosis not present

## 2022-03-23 DIAGNOSIS — K047 Periapical abscess without sinus: Secondary | ICD-10-CM | POA: Diagnosis present

## 2022-03-23 DIAGNOSIS — L03211 Cellulitis of face: Secondary | ICD-10-CM | POA: Diagnosis present

## 2022-03-23 LAB — BASIC METABOLIC PANEL
Anion gap: 8 (ref 5–15)
BUN: 7 mg/dL (ref 6–20)
CO2: 24 mmol/L (ref 22–32)
Calcium: 10.1 mg/dL (ref 8.9–10.3)
Chloride: 104 mmol/L (ref 98–111)
Creatinine, Ser: 0.66 mg/dL (ref 0.44–1.00)
GFR, Estimated: >60 mL/min (ref >60)
Glucose, Bld: 229 mg/dL — ABNORMAL HIGH (ref 70–99)
Potassium: 4 mmol/L (ref 3.5–5.1)
Sodium: 136 mmol/L (ref 135–145)

## 2022-03-23 LAB — CBC
HCT: 40.5 % (ref 36.0–46.0)
Hemoglobin: 12.5 g/dL (ref 12.0–15.0)
MCH: 22.1 pg — ABNORMAL LOW (ref 26.0–34.0)
MCHC: 30.9 g/dL (ref 30.0–36.0)
MCV: 71.6 fL — ABNORMAL LOW (ref 80.0–100.0)
Platelets: 294 10*3/uL (ref 150–400)
RBC: 5.66 MIL/uL — ABNORMAL HIGH (ref 3.87–5.11)
RDW: 14.7 % (ref 11.5–15.5)
WBC: 21.3 10*3/uL — ABNORMAL HIGH (ref 4.0–10.5)
nRBC: 0 % (ref 0.0–0.2)

## 2022-03-23 LAB — HIV ANTIBODY (ROUTINE TESTING W REFLEX): HIV Screen 4th Generation wRfx: NONREACTIVE

## 2022-03-23 MED ORDER — WHITE PETROLATUM EX OINT
TOPICAL_OINTMENT | CUTANEOUS | Status: DC | PRN
  Filled 2022-03-23: qty 28.35

## 2022-03-23 MED ORDER — ENOXAPARIN SODIUM 40 MG/0.4ML IJ SOSY
40.0000 mg | PREFILLED_SYRINGE | INTRAMUSCULAR | Status: DC
  Administered 2022-03-23 – 2022-03-24 (×2): 40 mg via SUBCUTANEOUS
  Filled 2022-03-23 (×2): qty 0.4

## 2022-03-23 NOTE — Progress Notes (Signed)
Progress Note ?  ?  ?ID: 80 yoF patient with a impacted #17 with associated cystic lesion and cortical erosion and resulting left masticator/buccal space infection s/p removal of teeth #17, 20, 23-25 and I&D on 03/22/22.   ?  ?Interval History ?11/2 - POD 1. AF, VSS. WBC bump from 13 to 21, likely 2/2 surgery and steroids. Will monitor closely.  ?  ?Clinical Exam: ?  Extraoral swelling is moderate, improved in the neck/periorbital areas, and firm/prominent in the buccal/masseteric area ?       MIO is 56m ?            Intraorally sutures and drain are intact and no purulence can be appreciated ?  ?  ?Assessment: 89yoF patient with a impacted #17 with associated cystic lesion and cortical erosion and resulting left masticator/buccal space infection s/p removal of teeth #17, 20, 23-25 and I&D on 03/22/22.  Patient is on the floor and is clinically stable being treated with IV Unasyn. ?  ?  ?Plan: ?-Appreciate medicine's management of patient's systemic health ?  ?OMFS Recommendations ?-Okay for soft mechanical diet today ?-Peridex (chlorhexidine) mouthrnise QID ?-Change Kerlix dressing as needed ?-Continue Unasyn q6hr ?-Follow cultures ?-Daily CBC w/ diff  ?  ?  ?  ?CTerese Door DDS ?Oral and Maxillofacial Surgeon ?[facility] ?Office # [phone]?Cell # [phone] ?

## 2022-03-23 NOTE — Progress Notes (Signed)
Mobility Specialist Progress Note: ? ? 03/23/22 1208  ?Mobility  ?Activity Ambulated with assistance in hallway  ?Level of Assistance Independent  ?Assistive Device None  ?Distance Ambulated (ft) 570 ft  ?Activity Response Tolerated well  ?$Mobility charge 1 Mobility  ? ?Pt received at door asking to walk. Complaints of pain in her jaw. Left in bed with call bell in reach and all needs met.  ? ?Robin Richards ?Mobility Specialist ?Primary Phone [phone] ? ?

## 2022-03-23 NOTE — Progress Notes (Signed)
? ?[facility] ?PROGRESS NOTE ? ? ?Robin Richards HBZ:[id number] DOB: 1976-02-19 DOA: 03/22/2022 ? ?PCP: Sonia Side., FNP ? ?Brief History/Interval Summary: 46 y.o. female with a past medical history of asthma, hyperlipidemia presented to the emergency department earlier this morning with complaints of left-sided facial swelling and pain.  She apparently had some teeth pulled about 2 to 3 weeks prior to the presentation.  Found to have impacted left mandibular wisdom tooth with surrounding lucency and cortical destruction.  Patient underwent extraction.  There was also a cyst found which was also extracted.  Hospitalized for IV antibiotics. ? ? ?Consultants: Dr. Conley Simmonds with dental medicine ? ?Procedures:   ? ?Surgical removal #20, 23-25  ?Removal of full bony impacted #17 ?Excision of cystic lesion associated with #17 ?I&D of buccal and masticator spaces ? ? ? ?Subjective/Interval History: ?Patient mentions that her pain is reasonably well controlled.  Continues to have swelling in the left side of her face.  Denies any nausea vomiting.  No fever or chills. ? ? ? ?Assessment/Plan: ? ?Dental infection with component of facial cellulitis ?She is status post dental procedure.  WBC noted to be higher today at 21.3 compared to yesterday which was 13.8.  Continue Unasyn for now.  Lactic acid level was normal.  Reassuringly she is afebrile.  Continue to monitor closely and recheck labs tomorrow.  Pain is reasonably well controlled. ? ?History of asthma ?Stable.  Does not appear that she is compliant with her home inhaler regimen.  No wheezing appreciated. ? ?Hyperlipidemia ?Resume statin at discharge. ? ?DVT Prophylaxis: Initiate Lovenox ?Code Status: Full code ?Family Communication: Discussed with patient ?Disposition Plan: Hopefully discharge home when improved ? ?Status is: Observation ?The patient will require care spanning > 2 midnights and should be moved to inpatient because: Persistently elevated WBC,  continue need for IV antibiotic ? ? ? ?Medications: Scheduled: ? chlorhexidine  15 mL Mouth/Throat QID  ? Or  ? mouth rinse  15 mL Mouth Rinse QID  ? Chlorhexidine Gluconate Cloth  6 each Topical Once  ? And  ? Chlorhexidine Gluconate Cloth  6 each Topical Once  ? HYDROcodone-acetaminophen      ? ?Continuous: ? ampicillin-sulbactam (UNASYN) IV 3 g (03/23/22 1017)  ? ?PZW:CHENIDPOEUMPN **OR** acetaminophen, albuterol, HYDROcodone-acetaminophen, morphine injection, ondansetron **OR** ondansetron (ZOFRAN) IV ? ?Antibiotics: ?Anti-infectives (From admission, onward)  ? ? Start     Dose/Rate Route Frequency Ordered Stop  ? 03/22/22 2100  Ampicillin-Sulbactam (UNASYN) 3 g in sodium chloride 0.9 % 100 mL IVPB       ? 3 g ?200 mL/hr over 30 Minutes Intravenous Every 6 hours 03/22/22 1803    ? 03/22/22 1445  Ampicillin-Sulbactam (UNASYN) 3 g in sodium chloride 0.9 % 100 mL IVPB       ?Note to Pharmacy: Please tube to OR  ? 3 g ?200 mL/hr over 30 Minutes Intravenous  Once 03/22/22 1435 03/22/22 1523  ? 03/22/22 0830  Ampicillin-Sulbactam (UNASYN) 3 g in sodium chloride 0.9 % 100 mL IVPB       ? 3 g ?200 mL/hr over 30 Minutes Intravenous  Once 03/22/22 0811 03/22/22 0945  ? ?  ? ? ?Objective: ? ?Vital Signs ? ?Vitals:  ? 03/22/22 2100 03/22/22 2150 03/23/22 0137 03/23/22 0552  ?BP:  129/74 (!) 112/95 127/83  ?Pulse: 90 79 69 65  ?Resp: 17 16 17 17   ?Temp: 98.1 ?F (36.7 ?C) 98.1 ?F (36.7 ?C) 98 ?F (36.7 ?C) 98.5 ?F (36.9 ?C)  ?  TempSrc:  Oral Oral Oral  ?SpO2: 96% 100% 99% 100%  ?Weight:      ?Height:      ? ? ?Intake/Output Summary (Last 24 hours) at 03/23/2022 0835 ?Last data filed at 03/23/2022 [phone] ?Gross per 24 hour  ?Intake 2030 ml  ?Output 20 ml  ?Net 2010 ml  ? ?Filed Weights  ? 03/22/22 1254  ?Weight: 61.2 kg  ? ? ?General appearance: Awake alert.  In no distress ?Significant left-sided facial swelling noted with some warmth.  No erythema noted.  Able to open her left eye better than yesterday.  Denies any visual  disturbances. ?Resp: Clear to auscultation bilaterally.  Normal effort ?Cardio: S1-S2 is normal regular.  No S3-S4.  No rubs murmurs or bruit ?GI: Abdomen is soft.  Nontender nondistended.  Bowel sounds are present normal.  No masses organomegaly ?Extremities: No edema.  Full range of motion of lower extremities. ?Neurologic: Alert and oriented x3.  No focal neurological deficits.  ? ? ?Lab Results: ? ?Data Reviewed: I have personally reviewed following labs and reports of the imaging studies ? ?CBC: ?Recent Labs  ?Lab 03/22/22 ?3704 03/23/22 ?0149  ?WBC 13.8* 21.3*  ?NEUTROABS 9.8*  --   ?HGB 13.3 12.5  ?HCT 44.2 40.5  ?MCV 73.4* 71.6*  ?PLT 223 294  ? ? ?Basic Metabolic Panel: ?Recent Labs  ?Lab 03/22/22 ?8889 03/23/22 ?0149  ?NA 136 136  ?K 4.1 4.0  ?CL 107 104  ?CO2 21* 24  ?GLUCOSE 180* 229*  ?BUN 12 7  ?CREATININE 0.59 0.66  ?CALCIUM 9.9 10.1  ? ? ?GFR: ?Estimated Creatinine Clearance: 74.8 mL/min (by C-G formula based on SCr of 0.66 mg/dL). ? ?Liver Function Tests: ?Recent Labs  ?Lab 03/22/22 ?1694  ?AST 12*  ?ALT 14  ?ALKPHOS 130*  ?BILITOT 0.9  ?PROT 6.8  ?ALBUMIN 3.7  ? ? ? ?Recent Results (from the past 240 hour(s))  ?Aerobic/Anaerobic Culture w Gram Stain (surgical/deep wound)     Status: None (Preliminary result)  ? Collection Time: 03/22/22  3:04 PM  ? Specimen: PATH Other; Tissue  ?Result Value Ref Range Status  ? Specimen Description TISSUE  Final  ? Special Requests   Final  ?  LEFT MANDIBLE ABSCESS ?Performed at [facility], [place] [address], [place], [place] [address] ?  ? Gram Stain PENDING  Incomplete  ? Culture PENDING  Incomplete  ? Report Status PENDING  Incomplete  ?  ? ? ?Radiology Studies: ?DG Orthopantogram ? ?Result Date: 03/22/2022 ?CLINICAL DATA:  Left-sided facial swelling. Recent tooth extractions. EXAM: ORTHOPANTOGRAM/PANORAMIC COMPARISON:  CT maxillofacial, same date. FINDINGS: As demonstrated on the CT scan there is lucency surrounding and impacted left mandibular wisdom tooth  with cortical erosion. The associated soft tissue masses better demonstrated on the CT scan. There is also an impacted right mandibular wisdom tooth but no surrounding lucency. The other remaining teeth appear to be intact. IMPRESSION: 1. Impacted left mandibular wisdom tooth with surrounding lucency and cortical destruction better demonstrated on the CT scan. 2. Impacted right mandibular wisdom tooth without surrounding lucency. 3. The other teeth are intact. Electronically Signed   By: Marijo Sanes M.D.   On: 03/22/2022 10:08  ? ?CT Maxillofacial W Contrast ? ?Result Date: 03/22/2022 ?CLINICAL DATA:  46 year old female with possible facial abscess. EXAM: CT MAXILLOFACIAL WITH CONTRAST TECHNIQUE: Multidetector CT imaging of the maxillofacial structures was performed with intravenous contrast. Multiplanar CT image reconstructions were also generated. RADIATION DOSE REDUCTION: This exam was performed according to  the departmental dose-optimization program which includes automated exposure control, adjustment of the mA and/or kV according to patient size and/or use of iterative reconstruction technique. CONTRAST:  116m OMNIPAQUE IOHEXOL 300 MG/ML  SOLN COMPARISON:  None. FINDINGS: Osseous: Un-erupted mandible wisdom teeth, with a left-side associated dentigerous cyst (series 10, image 57). But there is associated broad-based dehiscence of bone in an area of 5 mm at the lateral mandible body there (series 5, image 37) and diffusely enlarged and heterogeneous left masticator space in that region. Linear relatively hyperdense material extends through the bony defect in the masticator space on series 7, image 55, and associated masseter muscle enlargement in an area of 45 x 27 x 48 mm (AP by transverse by CC) is mostly intermediate to high density. There is a small 14 mm internal component of lower density (series 7, image 52) but this does not reach simple fluid density. Associated left facial swelling and subcutaneous  stranding, mass effect on the left parotid space without evidence of invasion outside of the masticator space. No soft tissue gas. Other dental periapical lucency especially bilateral anterior mandible bicus

## 2022-03-24 LAB — CBC WITH DIFFERENTIAL/PLATELET
Abs Immature Granulocytes: 0.05 10*3/uL (ref 0.00–0.07)
Basophils Absolute: 0 10*3/uL (ref 0.0–0.1)
Basophils Relative: 0 %
Eosinophils Absolute: 0.1 10*3/uL (ref 0.0–0.5)
Eosinophils Relative: 1 %
HCT: 39.3 % (ref 36.0–46.0)
Hemoglobin: 12.4 g/dL (ref 12.0–15.0)
Immature Granulocytes: 0 %
Lymphocytes Relative: 22 %
Lymphs Abs: 3.7 10*3/uL (ref 0.7–4.0)
MCH: 22.3 pg — ABNORMAL LOW (ref 26.0–34.0)
MCHC: 31.6 g/dL (ref 30.0–36.0)
MCV: 70.6 fL — ABNORMAL LOW (ref 80.0–100.0)
Monocytes Absolute: 1.5 10*3/uL — ABNORMAL HIGH (ref 0.1–1.0)
Monocytes Relative: 9 %
Neutro Abs: 11.3 10*3/uL — ABNORMAL HIGH (ref 1.7–7.7)
Neutrophils Relative %: 68 %
Platelets: 309 10*3/uL (ref 150–400)
RBC: 5.57 MIL/uL — ABNORMAL HIGH (ref 3.87–5.11)
RDW: 14.4 % (ref 11.5–15.5)
WBC: 16.7 10*3/uL — ABNORMAL HIGH (ref 4.0–10.5)
nRBC: 0 % (ref 0.0–0.2)

## 2022-03-24 LAB — BASIC METABOLIC PANEL
Anion gap: 8 (ref 5–15)
BUN: 8 mg/dL (ref 6–20)
CO2: 24 mmol/L (ref 22–32)
Calcium: 10.3 mg/dL (ref 8.9–10.3)
Chloride: 104 mmol/L (ref 98–111)
Creatinine, Ser: 0.68 mg/dL (ref 0.44–1.00)
GFR, Estimated: >60 mL/min (ref >60)
Glucose, Bld: 122 mg/dL — ABNORMAL HIGH (ref 70–99)
Potassium: 4.7 mmol/L (ref 3.5–5.1)
Sodium: 136 mmol/L (ref 135–145)

## 2022-03-24 MED ORDER — HYDROCODONE-ACETAMINOPHEN 5-325 MG PO TABS
1.0000 | ORAL_TABLET | Freq: Four times a day (QID) | ORAL | 0 refills | Status: DC | PRN
Start: 2022-03-24 — End: 2022-03-24

## 2022-03-24 MED ORDER — AMOXICILLIN-POT CLAVULANATE 875-125 MG PO TABS
1.0000 | ORAL_TABLET | Freq: Two times a day (BID) | ORAL | Status: DC
  Administered 2022-03-24: 1 via ORAL
  Filled 2022-03-24: qty 1

## 2022-03-24 MED ORDER — HYDROCODONE-ACETAMINOPHEN 10-325 MG PO TABS: 0.5000 | ORAL_TABLET | Freq: Four times a day (QID) | ORAL | 0 refills | Status: DC | PRN

## 2022-03-24 MED ORDER — AMOXICILLIN-POT CLAVULANATE 875-125 MG PO TABS: 1.0000 | ORAL_TABLET | Freq: Two times a day (BID) | ORAL | 0 refills | Status: AC

## 2022-03-24 MED ORDER — CHLORHEXIDINE GLUCONATE 0.12 % MT SOLN
15.0000 mL | Freq: Four times a day (QID) | OROMUCOSAL | 0 refills | Status: DC
Start: 2022-03-24 — End: 2022-05-18

## 2022-03-24 NOTE — Progress Notes (Signed)
Discharge instructions provided to patient, patient verbalizes understandings. Heat pack provided to patient to help with swelling. Medications reviewed. Patient discharged  ?

## 2022-03-24 NOTE — Progress Notes (Signed)
Progress Note ?  ?  ?ID: 11 yoF patient with a impacted #17 with associated cystic lesion and cortical erosion and resulting left masticator/buccal space infection s/p removal of teeth #17, 20, 23-25 and I&D on 03/22/22.   ?  ?Interval History ?11/2 - POD 1. AF, VSS. WBC bump from 13 to 21, likely 2/2 surgery and steroids. Will monitor closely. Patient reports overall feeling better, pain is improved and mouth opening is slightly better.  ?11/3 - POD 12 AF, VSS. WBC down to 16. Patient continues to feel subjective improvement and really wants to discharge today. ?  ?Clinical Exam: ?  Extraoral swelling is moderate, improved in the neck/periorbital areas, and firm/prominent in the buccal/masseteric area ?       MIO is 15-20 mm ?            Intraorally sutures and drain are intact and no purulence can be appreciated; drain removed bedside ?  ?  ?Assessment: 47 yoF patient with a impacted #17 with associated cystic lesion and cortical erosion and resulting left masticator/buccal space infection s/p removal of teeth #17, 20, 23-25 and I&D on 03/22/22.  Patient is on the floor and is clinically improving being treated with IV Unasyn. ?  ?  ?Plan: ?  ?OMFS Recommendations ?-Patient is eager to discharge, given clinical subjective and objective improvement I am okay with this plan; recommend d/c Augmentin BID x 1 week and peridex rinse TID x 1 week. Informed patient if swelling does not continue to decrease she will need to return to ED for evaluation ?-Continue soft mechanical diet  ?-Follow cultures ?  ?  ?Terese Door, DDS ?Oral and Maxillofacial Surgeon ?[facility] ([place]) ?Office # [phone] ?Cell # [phone]  ?

## 2022-03-24 NOTE — Progress Notes (Signed)
Pt ready for DC, wheeled out to car and to driven home by family. ?

## 2022-03-24 NOTE — Discharge Summary (Signed)
?[facility] ? ?Physician Discharge Summary  ? ?Patient ID: ?Robin Richards ?MRN: [id number] ?DOB/AGE: 1976/02/07 46 y.o. ? ?Admit date: 03/22/2022 ?Discharge date: 03/24/2022   ? ?PCP: Sonia Side., FNP ? ?DISCHARGE DIAGNOSES:  ?Dental infection ?Left facial cellulitis ?History of asthma ?Hyperlipidemia ? ?RECOMMENDATIONS FOR OUTPATIENT FOLLOW UP: ?Follow-up with oral surgery as recommended ? ? ?Home Health: None ?Equipment/Devices: None ? ?CODE STATUS: Full code ? ?DISCHARGE CONDITION: fair ? ?Diet recommendation: As before ? ?INITIAL HISTORY: ?46 y.o. female with a past medical history of asthma, hyperlipidemia presented to the emergency department earlier this morning with complaints of left-sided facial swelling and pain.  She apparently had some teeth pulled about 2 to 3 weeks prior to the presentation.  Found to have impacted left mandibular wisdom tooth with surrounding lucency and cortical destruction.  Patient underwent extraction.  There was also a cyst found which was also extracted.  Hospitalized for IV antibiotics. ?  ?  ?Consultants: Dr. Conley Simmonds with dental medicine ?  ?Procedures:   ?  ?Surgical removal #20, 23-25  ?Removal of full bony impacted #17 ?Excision of cystic lesion associated with #17 ?I&D of buccal and masticator spaces ? ? ?HOSPITAL COURSE:  ? ?Dental infection with component of facial cellulitis ?She is status post dental procedure.  Was continued on Unasyn.  Cultures were negative.  WBC started improving.  Symptoms have improved.  Swelling has improved.  Cleared by oral surgery for discharge.  Will be discharged on Augmentin.   ? ?History of asthma ?Stable.  Does not appear that she is compliant with her home inhaler regimen.  No wheezing appreciated. ? ?Hyperlipidemia ?Resume statin at discharge. ? ?Patient is stable.  Okay for discharge home today. ? ? ?PERTINENT LABS: ? ?The results of significant diagnostics from this hospitalization (including imaging, microbiology,  ancillary and laboratory) are listed below for reference.   ? ?Microbiology: ?Recent Results (from the past 240 hour(s))  ?Aerobic/Anaerobic Culture w Gram Stain (surgical/deep wound)     Status: None (Preliminary result)  ? Collection Time: 03/22/22  3:04 PM  ? Specimen: PATH Other; Tissue  ?Result Value Ref Range Status  ? Specimen Description TISSUE  Final  ? Special Requests LEFT MANDIBLE ABSCESS  Final  ? Gram Stain   Final  ?  NO WBC SEEN ?MODERATE GRAM POSITIVE RODS ?FEW GRAM NEGATIVE RODS ?RARE GRAM POSITIVE COCCI IN PAIRS ?Performed at [facility], [place] [address] [place], [place] [address] ?  ? Culture   Final  ?  NORMAL OROPHARYNGEAL FLORA ?MIXED ANAEROBIC FLORA PRESENT.  CALL LAB IF FURTHER IID REQUIRED. ?  ? Report Status PENDING  Incomplete  ?  ? ?Labs: ? ? ? ? ?Basic Metabolic Panel: ?Recent Labs  ?Lab 03/22/22 ?5885 03/23/22 ?0149 03/24/22 ?0132  ?NA 136 136 136  ?K 4.1 4.0 4.7  ?CL 107 104 104  ?CO2 21* 24 24  ?GLUCOSE 180* 229* 122*  ?BUN 12 7 8   ?CREATININE 0.59 0.66 0.68  ?CALCIUM 9.9 10.1 10.3  ? ?Liver Function Tests: ?Recent Labs  ?Lab 03/22/22 ?0277  ?AST 12*  ?ALT 14  ?ALKPHOS 130*  ?BILITOT 0.9  ?PROT 6.8  ?ALBUMIN 3.7  ? ? ?CBC: ?Recent Labs  ?Lab 03/22/22 ?4128 03/23/22 ?0149 03/24/22 ?0132  ?WBC 13.8* 21.3* 16.7*  ?NEUTROABS 9.8*  --  11.3*  ?HGB 13.3 12.5 12.4  ?HCT 44.2 40.5 39.3  ?MCV 73.4* 71.6* 70.6*  ?PLT 223 294 309  ? ? ? ?IMAGING STUDIES ?DG  Orthopantogram ? ?Result Date: 03/22/2022 ?CLINICAL DATA:  Left-sided facial swelling. Recent tooth extractions. EXAM: ORTHOPANTOGRAM/PANORAMIC COMPARISON:  CT maxillofacial, same date. FINDINGS: As demonstrated on the CT scan there is lucency surrounding and impacted left mandibular wisdom tooth with cortical erosion. The associated soft tissue masses better demonstrated on the CT scan. There is also an impacted right mandibular wisdom tooth but no surrounding lucency. The other remaining teeth appear to be intact. IMPRESSION: 1.  Impacted left mandibular wisdom tooth with surrounding lucency and cortical destruction better demonstrated on the CT scan. 2. Impacted right mandibular wisdom tooth without surrounding lucency. 3. The other teeth are intact. Electronically Signed   By: Marijo Sanes M.D.   On: 03/22/2022 10:08  ? ?CT Maxillofacial W Contrast ? ?Result Date: 03/22/2022 ?CLINICAL DATA:  46 year old female with possible facial abscess. EXAM: CT MAXILLOFACIAL WITH CONTRAST TECHNIQUE: Multidetector CT imaging of the maxillofacial structures was performed with intravenous contrast. Multiplanar CT image reconstructions were also generated. RADIATION DOSE REDUCTION: This exam was performed according to the departmental dose-optimization program which includes automated exposure control, adjustment of the mA and/or kV according to patient size and/or use of iterative reconstruction technique. CONTRAST:  165m OMNIPAQUE IOHEXOL 300 MG/ML  SOLN COMPARISON:  None. FINDINGS: Osseous: Un-erupted mandible wisdom teeth, with a left-side associated dentigerous cyst (series 10, image 57). But there is associated broad-based dehiscence of bone in an area of 5 mm at the lateral mandible body there (series 5, image 37) and diffusely enlarged and heterogeneous left masticator space in that region. Linear relatively hyperdense material extends through the bony defect in the masticator space on series 7, image 55, and associated masseter muscle enlargement in an area of 45 x 27 x 48 mm (AP by transverse by CC) is mostly intermediate to high density. There is a small 14 mm internal component of lower density (series 7, image 52) but this does not reach simple fluid density. Associated left facial swelling and subcutaneous stranding, mass effect on the left parotid space without evidence of invasion outside of the masticator space. No soft tissue gas. Other dental periapical lucency especially bilateral anterior mandible bicuspids. Left maxillary medial  incisor root canal changes. No other No acute osseous abnormality identified. Partially visible bulky anterior cervical spine endplate osteophytes. Orbits: Intact orbital walls. Orbits soft tissues appears symmetric and within normal limits. Sinuses: Only mild scattered paranasal sinus mucosal thickening. Tympanic cavities and mastoids are aerated. Soft tissues: Abnormal left masticator space and overlying facial soft tissues as above. Superimposed visible thyroid, larynx, pharynx, retropharyngeal space, sublingual space, right parapharyngeal, right submandibular, and right parotid spaces are within normal limits. There is mild inflammation in the left parapharyngeal space. And there is thickening of the left platysma with edema tracking superficial to the left submandibular space. There are small but reactive appearing left level 1B and level 2 lymph nodes up to 8-9 mm short axis (series 3, image 19). The major vascular structures in the neck and at the skull base remain patent with dominant right IJ. Dominant right vertebral artery. Limited intracranial: Negative. IMPRESSION: Marked heterogeneous enlargement of the lower left masticator space and left masseter muscle appears related to sequelae of impacted left mandible wisdom tooth dentigerous cyst which has eroded out from the posterior mandible body. The 4-5 cm expansion of the muscle is heterogeneous and mostly hyperdense, more resembling soft tissue tumor than a complicated cyst or abscess. But there is regional soft tissue inflammation in both the subcutaneous and left parapharyngeal spaces, and  regional reactive appearing lymph nodes. Recommend OMFS consultation. Electronically Signed   By: Genevie Ann M.D.   On: 03/22/2022 07:29   ? ?DISCHARGE EXAMINATION: ?Vitals:  ? 03/23/22 1748 03/23/22 2041 03/24/22 6967 03/24/22 8938  ?BP: (!) 147/89 (!) 118/96 (!) 126/91 124/75  ?Pulse: 88 77 81 75  ?Resp: 17 18 17 18   ?Temp: 97.9 ?F (36.6 ?C) 98.1 ?F (36.7 ?C) 97.8 ?F  (36.6 ?C) 98 ?F (36.7 ?C)  ?TempSrc: Oral Oral Oral Oral  ?SpO2: 100% 99% 97% 98%  ?Weight:      ?Height:      ? ?General appearance: Awake alert.  In no distress ?Improvement in left-sided facial swelling ?Resp:

## 2022-03-24 NOTE — Progress Notes (Signed)
Mobility Specialist Progress Note: ? ? 03/24/22 1121  ?Mobility  ?Activity Ambulated with assistance in hallway  ?Level of Assistance Independent  ?Assistive Device None  ?Distance Ambulated (ft) 570 ft  ?Activity Response Tolerated well  ?$Mobility charge 1 Mobility  ? ?Pt received in bed willing to participate in mobility. Complaints of 2/10 jaw pain. Left in bed with call bell in reach and all needs met.  ? ?Robin Richards ?Mobility Specialist ?Primary Phone [phone] ? ?

## 2022-03-27 LAB — AEROBIC/ANAEROBIC CULTURE W GRAM STAIN (SURGICAL/DEEP WOUND)
Culture: NORMAL
Gram Stain: NONE SEEN

## 2022-03-27 LAB — SURGICAL PATHOLOGY

## 2022-04-28 ENCOUNTER — Emergency Department (HOSPITAL_COMMUNITY)
Admission: EM | Admit: 2022-04-28 | Discharge: 2022-04-28 | Disposition: A | Discharge status: Home / Self Care | Payer: Medicare Other | Attending: Emergency Medicine | Admitting: Emergency Medicine

## 2022-04-28 ENCOUNTER — Emergency Department (HOSPITAL_COMMUNITY): Payer: Medicare Other

## 2022-04-28 ENCOUNTER — Other Ambulatory Visit: Payer: Self-pay

## 2022-04-28 ENCOUNTER — Encounter (HOSPITAL_COMMUNITY): Payer: Self-pay

## 2022-04-28 DIAGNOSIS — R220 Localized swelling, mass and lump, head: Secondary | ICD-10-CM | POA: Diagnosis not present

## 2022-04-28 LAB — COMPREHENSIVE METABOLIC PANEL
ALT: 19 U/L (ref 0–44)
AST: 16 U/L (ref 15–41)
Albumin: 3.7 g/dL (ref 3.5–5.0)
Alkaline Phosphatase: 155 U/L — ABNORMAL HIGH (ref 38–126)
Anion gap: 7 (ref 5–15)
BUN: 9 mg/dL (ref 6–20)
CO2: 25 mmol/L (ref 22–32)
Calcium: 10.2 mg/dL (ref 8.9–10.3)
Chloride: 106 mmol/L (ref 98–111)
Creatinine, Ser: 0.58 mg/dL (ref 0.44–1.00)
GFR, Estimated: >60 mL/min (ref >60)
Glucose, Bld: 145 mg/dL — ABNORMAL HIGH (ref 70–99)
Potassium: 4.4 mmol/L (ref 3.5–5.1)
Sodium: 138 mmol/L (ref 135–145)
Total Bilirubin: 0.5 mg/dL (ref 0.3–1.2)
Total Protein: 6.4 g/dL — ABNORMAL LOW (ref 6.5–8.1)

## 2022-04-28 LAB — CBC WITH DIFFERENTIAL/PLATELET
Abs Immature Granulocytes: 0.01 10*3/uL (ref 0.00–0.07)
Basophils Absolute: 0.1 10*3/uL (ref 0.0–0.1)
Basophils Relative: 1 %
Eosinophils Absolute: 0.4 10*3/uL (ref 0.0–0.5)
Eosinophils Relative: 5 %
HCT: 41.3 % (ref 36.0–46.0)
Hemoglobin: 13 g/dL (ref 12.0–15.0)
Immature Granulocytes: 0 %
Lymphocytes Relative: 40 %
Lymphs Abs: 3.2 10*3/uL (ref 0.7–4.0)
MCH: 22.9 pg — ABNORMAL LOW (ref 26.0–34.0)
MCHC: 31.5 g/dL (ref 30.0–36.0)
MCV: 72.7 fL — ABNORMAL LOW (ref 80.0–100.0)
Monocytes Absolute: 0.5 10*3/uL (ref 0.1–1.0)
Monocytes Relative: 7 %
Neutro Abs: 3.8 10*3/uL (ref 1.7–7.7)
Neutrophils Relative %: 47 %
Platelets: 280 10*3/uL (ref 150–400)
RBC: 5.68 MIL/uL — ABNORMAL HIGH (ref 3.87–5.11)
RDW: 15.4 % (ref 11.5–15.5)
WBC: 8 10*3/uL (ref 4.0–10.5)
nRBC: 0 % (ref 0.0–0.2)

## 2022-04-28 MED ORDER — SODIUM CHLORIDE 0.9 % IV BOLUS
1000.0000 mL | Freq: Once | INTRAVENOUS | Status: DC
Start: 2022-04-28 — End: 2022-04-28

## 2022-04-28 MED ORDER — MORPHINE SULFATE (PF) 4 MG/ML IV SOLN: 4.0000 mg | Freq: Once | INTRAVENOUS | Status: DC

## 2022-04-28 MED ORDER — AMOXICILLIN-POT CLAVULANATE 875-125 MG PO TABS
1.0000 | ORAL_TABLET | Freq: Two times a day (BID) | ORAL | 0 refills | Status: DC
Start: 2022-04-28 — End: 2022-05-18

## 2022-04-28 MED ORDER — IOHEXOL 300 MG/ML  SOLN
100.0000 mL | Freq: Once | INTRAMUSCULAR | Status: AC | PRN
  Administered 2022-04-28: 100 mL via INTRAVENOUS

## 2022-04-28 MED ORDER — ONDANSETRON HCL 4 MG/2ML IJ SOLN: 4.0000 mg | Freq: Once | INTRAMUSCULAR | Status: DC

## 2022-04-28 NOTE — ED Triage Notes (Signed)
Patient recently here for left jaw swelling and has sugery to drain abscess.  Patient reports antibiotics are not working and her face is swelling again.  Unsure if it is from tooth infection or another type of infection  Reports he dentist doenst take her medicaid and the others she is only on the waiting list.  Reports the swelling is effecting her vision.

## 2022-04-28 NOTE — ED Provider Notes (Signed)
[facility] EMERGENCY DEPARTMENT Provider Note   CSN: [id number] Arrival date & time: 04/28/22  1119     History  Chief Complaint  Patient presents with   Facial Swelling    Robin Richards is a 46 y.o. female.  46 year old female presents today for evaluation of left-sided facial swelling of about 3-day duration.  Endorses chills, difficulty swallowing since last night.  She has history of dental infection for which she was admitted and underwent OR incision and drainage towards the end of April.  She states she completed her antibiotics 4 days ago.  She states she went to the dental office for follow-up because she needed to have 2 teeth removed however was turned down because of her insurance.  The history is provided by the patient. No language interpreter was used.      Home Medications Prior to Admission medications   Medication Sig Start Date End Date Taking? Authorizing Provider  acetaminophen (TYLENOL) 500 MG tablet Take 500 mg by mouth every 6 (six) hours as needed for mild pain.    [provider]  albuterol (PROVENTIL HFA;VENTOLIN HFA) 108 (90 Base) MCG/ACT inhaler Inhale 1-2 puffs into the lungs every 6 (six) hours as needed for wheezing or shortness of breath. 01/28/18   Henson, Vickie L, NP-C  atorvastatin (LIPITOR) 20 MG tablet Take 20 mg by mouth daily. 03/17/22   [provider]  chlorhexidine (PERIDEX) 0.12 % solution Use as directed 15 mLs in the mouth or throat 4 (four) times daily. 03/24/22   Bonnielee Haff, MD  HYDROcodone-acetaminophen [facility]) 10-325 MG tablet Take 0.5 tablets by mouth every 6 (six) hours as needed for severe pain. 03/24/22   Bonnielee Haff, MD  ibuprofen (ADVIL,MOTRIN) 600 MG tablet TAKE 1 TABLET BY MOUTH EVERY 6 HOURS AS NEEDED FOR FEVER,HEADACHE,OR MILD PAIN Patient taking differently: Take 600 mg by mouth every 6 (six) hours as needed for mild pain. 03/07/18   Henson, Laurian Brim, NP-C      Allergies     Patient has no known allergies.    Review of Systems   Review of Systems  Constitutional:  Positive for chills. Negative for activity change and fever.  HENT:  Positive for dental problem and facial swelling. Negative for drooling.   Eyes:  Positive for visual disturbance.  Respiratory:  Negative for shortness of breath.   Neurological:  Negative for headaches.  All other systems reviewed and are negative.  Physical Exam Updated Vital Signs BP (!) 143/109 (BP Location: Right Arm)   Pulse 87   Temp 98.3 F (36.8 C) (Oral)   Resp 16   Ht 5' 2"  (1.575 m)   Wt 61.2 kg   SpO2 99%   BMI 24.69 kg/m  Physical Exam Vitals and nursing note reviewed.  Constitutional:      General: She is not in acute distress.    Appearance: Normal appearance. She is not ill-appearing.  HENT:     Head: Normocephalic and atraumatic.     Nose: Nose normal.     Mouth/Throat:     Comments: Left-sided facial swelling noted.  No identifiable abscess noted intraorally.  Without retropharyngeal abscess, peritonsillar abscess, or Ludwick's angina's.  Poor dentition overall. Eyes:     General: No scleral icterus.    Extraocular Movements: Extraocular movements intact.     Conjunctiva/sclera: Conjunctivae normal.  Cardiovascular:     Rate and Rhythm: Normal rate and regular rhythm.     Pulses: Normal pulses.  Pulmonary:  Effort: Pulmonary effort is normal. No respiratory distress.     Breath sounds: Normal breath sounds. No wheezing or rales.  Abdominal:     General: There is no distension.     Tenderness: There is no abdominal tenderness.  Musculoskeletal:        General: Normal range of motion.     Cervical back: Normal range of motion.  Skin:    General: Skin is warm and dry.  Neurological:     General: No focal deficit present.     Mental Status: She is alert. Mental status is at baseline.    ED Results / Procedures / Treatments   Labs (all labs ordered are listed, but only abnormal  results are displayed) Labs Reviewed  CBC WITH DIFFERENTIAL/PLATELET - Abnormal; Notable for the following components:      Result Value   RBC 5.68 (*)    MCV 72.7 (*)    MCH 22.9 (*)    All other components within normal limits  COMPREHENSIVE METABOLIC PANEL - Abnormal; Notable for the following components:   Glucose, Bld 145 (*)    Total Protein 6.4 (*)    Alkaline Phosphatase 155 (*)    All other components within normal limits    EKG None  Radiology No results found.  Procedures Procedures    Medications Ordered in ED Medications  morphine (PF) 4 MG/ML injection 4 mg (has no administration in time range)  ondansetron (ZOFRAN) injection 4 mg (has no administration in time range)  sodium chloride 0.9 % bolus 1,000 mL (has no administration in time range)    ED Course/ Medical Decision Making/ A&P Clinical Course as of 04/28/22 1619  Fri Apr 28, 2022  1444 CT Maxillofacial W Contrast [AA]    Clinical Course User Index [AA] Evlyn Courier, PA-C                           Medical Decision Making Amount and/or Complexity of Data Reviewed Labs: ordered. Radiology: ordered. Decision-making details documented in ED Course.  Risk Prescription drug management.   46 year old female presents today for evaluation of left-sided facial swelling for about 3 days.  Previously admitted for same and required IV antibiotics, and incision and drainage in the OR.  Patient completed her antibiotics 4 days ago and symptoms started 3 days ago.  She is without fever, is well-appearing.  Without trismus, retropharyngeal abscess, peritonsillar abscess.  Discussed with Dr. Haig Prophet following CT maxillofacial who recommends p.o. antibiotics and follow-up outpatient for teeth extraction.  She does have Medicare and Medicaid.  Dr. Haig Prophet states there is plenty of clinics in the community who will accept her insurance.  Patient provided with Dr. Drinda Butts information as well as a Clinical research associate  guide for dental clinics that in the community.  She is well-appearing and is appropriate for discharge and voices understanding and is in agreement with plan.  Augmentin prescribed.  Return precautions discussed.  Patient voices understanding and is in agreement with plan.  Patient was evaluated by my attending as well.   Final Clinical Impression(s) / ED Diagnoses Final diagnoses:  Facial swelling    Rx / DC Orders ED Discharge Orders          Ordered    amoxicillin-clavulanate (AUGMENTIN) 875-125 MG tablet  Every 12 hours        04/28/22 1530              Evlyn Courier, PA-C 04/28/22  [place], MD 04/28/22 [phone]

## 2022-04-28 NOTE — Discharge Instructions (Addendum)
Your CT scan did not show any concerning findings or a drainable abscess.  I discussed your case with the dentist on-call.  He recommends starting you on antibiotics and following up with a dentist in the community to have the teeth removed that were previously recommended to be taken out.  If you develop any worsening symptoms, fever please return to the emergency room.  I have attached a list of dentist out in the community as well as our on-call dentist information.

## 2022-05-16 ENCOUNTER — Other Ambulatory Visit: Payer: Self-pay

## 2022-05-16 ENCOUNTER — Encounter (HOSPITAL_COMMUNITY): Payer: Self-pay

## 2022-05-16 ENCOUNTER — Inpatient Hospital Stay (HOSPITAL_COMMUNITY)
Admission: EM | Admit: 2022-05-16 | Discharge: 2022-05-18 | DRG: 558 | Disposition: A | Discharge status: Home / Self Care | Payer: Medicare Other | Attending: Internal Medicine | Admitting: Internal Medicine

## 2022-05-16 DIAGNOSIS — M6008 Infective myositis, other site: Secondary | ICD-10-CM | POA: Diagnosis not present

## 2022-05-16 DIAGNOSIS — F17210 Nicotine dependence, cigarettes, uncomplicated: Secondary | ICD-10-CM | POA: Diagnosis present

## 2022-05-16 DIAGNOSIS — J4540 Moderate persistent asthma, uncomplicated: Secondary | ICD-10-CM | POA: Diagnosis present

## 2022-05-16 DIAGNOSIS — E785 Hyperlipidemia, unspecified: Secondary | ICD-10-CM | POA: Diagnosis present

## 2022-05-16 DIAGNOSIS — Z833 Family history of diabetes mellitus: Secondary | ICD-10-CM

## 2022-05-16 DIAGNOSIS — L0201 Cutaneous abscess of face: Secondary | ICD-10-CM | POA: Diagnosis not present

## 2022-05-16 DIAGNOSIS — M419 Scoliosis, unspecified: Secondary | ICD-10-CM | POA: Diagnosis present

## 2022-05-16 DIAGNOSIS — E1165 Type 2 diabetes mellitus with hyperglycemia: Secondary | ICD-10-CM | POA: Diagnosis present

## 2022-05-16 DIAGNOSIS — Z79899 Other long term (current) drug therapy: Secondary | ICD-10-CM

## 2022-05-16 LAB — CBC WITH DIFFERENTIAL/PLATELET
Abs Immature Granulocytes: 0.03 10*3/uL (ref 0.00–0.07)
Basophils Absolute: 0.1 10*3/uL (ref 0.0–0.1)
Basophils Relative: 1 %
Eosinophils Absolute: 1.2 10*3/uL — ABNORMAL HIGH (ref 0.0–0.5)
Eosinophils Relative: 11 %
HCT: 40.6 % (ref 36.0–46.0)
Hemoglobin: 12.7 g/dL (ref 12.0–15.0)
Immature Granulocytes: 0 %
Lymphocytes Relative: 27 %
Lymphs Abs: 3.1 10*3/uL (ref 0.7–4.0)
MCH: 22.6 pg — ABNORMAL LOW (ref 26.0–34.0)
MCHC: 31.3 g/dL (ref 30.0–36.0)
MCV: 72.4 fL — ABNORMAL LOW (ref 80.0–100.0)
Monocytes Absolute: 1 10*3/uL (ref 0.1–1.0)
Monocytes Relative: 9 %
Neutro Abs: 5.9 10*3/uL (ref 1.7–7.7)
Neutrophils Relative %: 52 %
Platelets: 281 10*3/uL (ref 150–400)
RBC: 5.61 MIL/uL — ABNORMAL HIGH (ref 3.87–5.11)
RDW: 15.4 % (ref 11.5–15.5)
WBC: 11.2 10*3/uL — ABNORMAL HIGH (ref 4.0–10.5)
nRBC: 0 % (ref 0.0–0.2)

## 2022-05-16 LAB — COMPREHENSIVE METABOLIC PANEL
ALT: 15 U/L (ref 0–44)
AST: 17 U/L (ref 15–41)
Albumin: 3.8 g/dL (ref 3.5–5.0)
Alkaline Phosphatase: 169 U/L — ABNORMAL HIGH (ref 38–126)
Anion gap: 9 (ref 5–15)
BUN: 12 mg/dL (ref 6–20)
CO2: 24 mmol/L (ref 22–32)
Calcium: 10.4 mg/dL — ABNORMAL HIGH (ref 8.9–10.3)
Chloride: 106 mmol/L (ref 98–111)
Creatinine, Ser: 0.89 mg/dL (ref 0.44–1.00)
GFR, Estimated: >60 mL/min (ref >60)
Glucose, Bld: 191 mg/dL — ABNORMAL HIGH (ref 70–99)
Potassium: 4.5 mmol/L (ref 3.5–5.1)
Sodium: 139 mmol/L (ref 135–145)
Total Bilirubin: 0.6 mg/dL (ref 0.3–1.2)
Total Protein: 6.3 g/dL — ABNORMAL LOW (ref 6.5–8.1)

## 2022-05-16 NOTE — ED Provider Triage Note (Signed)
Emergency Medicine Provider Triage Evaluation Note  Robin Richards , a 45 y.o. female  was evaluated in triage.  Pt complains of left-sided facial swelling onset 6 months.  Patient notes that the facial swelling has not increased in size. Denies fever.  Has a dental appointment July 24 to have more teeth pulled.  Patient notes that she was supposed to have abscess drained due to her history of dental pain.  Review of Systems  Positive: As per HPI Negative:   Physical Exam  BP 121/75   Pulse 89   Temp 98 F (36.7 C) (Oral)   Resp 16   Ht 5' 2"  (1.575 m)   Wt 61.2 kg   LMP  (LMP Unknown)   SpO2 99%   BMI 24.69 kg/m  Gen:   Awake, no distress   Resp:  Normal effort  MSK:   Moves extremities without difficulty  Other:  Tenderness to palpation to left mucosal region without notable fluctuance.  Multiple dental caries noted throughout.  Moderate amount of swelling noted to the left cheek without overlying skin changes.  No fluctuance noted to the area.  Medical Decision Making  Medically screening exam initiated at 9:38 PM.  Appropriate orders placed.  Robin Richards was informed that the remainder of the evaluation will be completed by another provider, this initial triage assessment does not replace that evaluation, and the importance of remaining in the ED until their evaluation is complete.  Work-up initiated   Brodie Correll A, PA-C 05/16/22 2139

## 2022-05-16 NOTE — ED Triage Notes (Signed)
Pt presents with swelling to left cheek; hx of dental pain and was supposed to have the abscess drained. The swelling has been there for 6 months per pt report. States she has dental appt in July to have more teeth pulled.

## 2022-05-17 ENCOUNTER — Emergency Department (HOSPITAL_COMMUNITY): Payer: Medicare Other

## 2022-05-17 DIAGNOSIS — F17210 Nicotine dependence, cigarettes, uncomplicated: Secondary | ICD-10-CM | POA: Diagnosis present

## 2022-05-17 DIAGNOSIS — Z79899 Other long term (current) drug therapy: Secondary | ICD-10-CM | POA: Diagnosis not present

## 2022-05-17 DIAGNOSIS — L0201 Cutaneous abscess of face: Principal | ICD-10-CM

## 2022-05-17 DIAGNOSIS — M419 Scoliosis, unspecified: Secondary | ICD-10-CM | POA: Diagnosis present

## 2022-05-17 DIAGNOSIS — M6008 Infective myositis, other site: Secondary | ICD-10-CM | POA: Diagnosis present

## 2022-05-17 DIAGNOSIS — E785 Hyperlipidemia, unspecified: Secondary | ICD-10-CM | POA: Diagnosis present

## 2022-05-17 DIAGNOSIS — Z833 Family history of diabetes mellitus: Secondary | ICD-10-CM | POA: Diagnosis not present

## 2022-05-17 DIAGNOSIS — J4540 Moderate persistent asthma, uncomplicated: Secondary | ICD-10-CM | POA: Diagnosis present

## 2022-05-17 DIAGNOSIS — E1165 Type 2 diabetes mellitus with hyperglycemia: Secondary | ICD-10-CM | POA: Diagnosis present

## 2022-05-17 LAB — HEMOGLOBIN A1C
Hgb A1c MFr Bld: 8.4 % — ABNORMAL HIGH (ref 4.8–5.6)
Mean Plasma Glucose: 194.38 mg/dL

## 2022-05-17 LAB — CBG MONITORING, ED: Glucose-Capillary: 209 mg/dL — ABNORMAL HIGH (ref 70–99)

## 2022-05-17 LAB — GLUCOSE, CAPILLARY: Glucose-Capillary: 80 mg/dL (ref 70–99)

## 2022-05-17 MED ORDER — ONDANSETRON HCL 4 MG/2ML IJ SOLN: 4.0000 mg | Freq: Four times a day (QID) | INTRAMUSCULAR | Status: DC | PRN

## 2022-05-17 MED ORDER — LIDOCAINE VISCOUS HCL 2 % MT SOLN: 15.0000 mL | Freq: Once | OROMUCOSAL | Status: DC

## 2022-05-17 MED ORDER — CEFTRIAXONE SODIUM 1 G IJ SOLR
1.0000 g | Freq: Once | INTRAMUSCULAR | Status: AC
  Administered 2022-05-17: 1 g via INTRAVENOUS
  Filled 2022-05-17: qty 10

## 2022-05-17 MED ORDER — ACETAMINOPHEN 325 MG PO TABS: 650.0000 mg | ORAL_TABLET | Freq: Four times a day (QID) | ORAL | Status: DC | PRN

## 2022-05-17 MED ORDER — METRONIDAZOLE 500 MG/100ML IV SOLN
500.0000 mg | Freq: Three times a day (TID) | INTRAVENOUS | Status: DC
  Administered 2022-05-17 – 2022-05-18 (×3): 500 mg via INTRAVENOUS
  Filled 2022-05-17 (×3): qty 100

## 2022-05-17 MED ORDER — FAMOTIDINE 20 MG PO TABS: 20.0000 mg | ORAL_TABLET | Freq: Once | ORAL | Status: DC

## 2022-05-17 MED ORDER — MOMETASONE FURO-FORMOTEROL FUM 100-5 MCG/ACT IN AERO
2.0000 | INHALATION_SPRAY | Freq: Two times a day (BID) | RESPIRATORY_TRACT | Status: DC
  Administered 2022-05-17: 2 via RESPIRATORY_TRACT
  Filled 2022-05-17 (×2): qty 8.8

## 2022-05-17 MED ORDER — HYDROCODONE-ACETAMINOPHEN 5-325 MG PO TABS: 1.0000 | ORAL_TABLET | Freq: Four times a day (QID) | ORAL | Status: DC | PRN

## 2022-05-17 MED ORDER — SODIUM CHLORIDE 0.9 % IV SOLN: 3.0000 g | Freq: Once | INTRAVENOUS | Status: DC

## 2022-05-17 MED ORDER — SODIUM CHLORIDE 0.9 % IV SOLN
1.0000 g | Freq: Once | INTRAVENOUS | Status: AC
  Administered 2022-05-17: 1 g via INTRAVENOUS
  Filled 2022-05-17: qty 10

## 2022-05-17 MED ORDER — METRONIDAZOLE 500 MG/100ML IV SOLN
500.0000 mg | Freq: Once | INTRAVENOUS | Status: AC
  Administered 2022-05-17: 500 mg via INTRAVENOUS
  Filled 2022-05-17: qty 100

## 2022-05-17 MED ORDER — IOHEXOL 300 MG/ML  SOLN
50.0000 mL | Freq: Once | INTRAMUSCULAR | Status: AC | PRN
  Administered 2022-05-17: 50 mL via INTRAVENOUS

## 2022-05-17 MED ORDER — ACETAMINOPHEN 650 MG RE SUPP: 650.0000 mg | Freq: Four times a day (QID) | RECTAL | Status: DC | PRN

## 2022-05-17 MED ORDER — ALUM & MAG HYDROXIDE-SIMETH 200-200-20 MG/5ML PO SUSP: 30.0000 mL | Freq: Once | ORAL | Status: DC

## 2022-05-17 MED ORDER — SODIUM CHLORIDE 0.9 % IV SOLN
2.0000 g | INTRAVENOUS | Status: DC
  Administered 2022-05-18: 2 g via INTRAVENOUS
  Filled 2022-05-17: qty 20

## 2022-05-17 MED ORDER — ONDANSETRON HCL 4 MG PO TABS: 4.0000 mg | ORAL_TABLET | Freq: Four times a day (QID) | ORAL | Status: DC | PRN

## 2022-05-17 MED ORDER — INSULIN ASPART 100 UNIT/ML IJ SOLN
0.0000 [IU] | Freq: Three times a day (TID) | INTRAMUSCULAR | Status: DC
  Administered 2022-05-17: 5 [IU] via SUBCUTANEOUS
  Administered 2022-05-18: 3 [IU] via SUBCUTANEOUS

## 2022-05-17 MED ORDER — ATORVASTATIN CALCIUM 10 MG PO TABS
20.0000 mg | ORAL_TABLET | Freq: Every day | ORAL | Status: DC
  Administered 2022-05-18: 20 mg via ORAL
  Filled 2022-05-17: qty 2

## 2022-05-17 MED ORDER — POLYETHYLENE GLYCOL 3350 17 G PO PACK: 17.0000 g | PACK | Freq: Every day | ORAL | Status: DC | PRN

## 2022-05-17 NOTE — ED Provider Notes (Signed)
[facility] EMERGENCY DEPARTMENT Provider Note   CSN: [id number] Arrival date & time: 05/16/22  2059     History  Chief Complaint  Patient presents with   Facial Swelling   Dental Pain    Robin Richards is a 46 y.o. female.  ANNISHA Richards is a 46 y.o. female with a history of asthma, arthritis, scoliosis and recent dental abscess, who presents to the emergency department for evaluation of worsening left facial swelling.  Patient had an abscess and underwent I&D in the OR with Dr. Conley Simmonds on 4/26.  Patient reports she has continued to have some intermittent swelling after this and has been waiting to follow-up with a dentist in July.  She reports over the last week swelling and pain on the left side of the face have gotten worse and 2 days she ago she had a fever up to 101.  She called her PCP yesterday who called her in Augmentin which she has taken 1 dose of, but due to worsening pain and swelling she came in for further evaluation.  She reports pain and difficulty fully opening her mouth.   The history is provided by the patient and medical records.       Home Medications Prior to Admission medications   Medication Sig Start Date End Date Taking? Authorizing Provider  acetaminophen (TYLENOL) 500 MG tablet Take 500 mg by mouth every 6 (six) hours as needed for mild pain.    [provider]  albuterol (PROVENTIL HFA;VENTOLIN HFA) 108 (90 Base) MCG/ACT inhaler Inhale 1-2 puffs into the lungs every 6 (six) hours as needed for wheezing or shortness of breath. 01/28/18   Henson, Vickie L, NP-C  amoxicillin-clavulanate (AUGMENTIN) 875-125 MG tablet Take 1 tablet by mouth every 12 (twelve) hours. 04/28/22   Deatra Canter, Amjad, PA-C  atorvastatin (LIPITOR) 20 MG tablet Take 20 mg by mouth daily. 03/17/22   [provider]  chlorhexidine (PERIDEX) 0.12 % solution Use as directed 15 mLs in the mouth or throat 4 (four) times daily. 03/24/22   Bonnielee Haff, MD   HYDROcodone-acetaminophen [facility]) 10-325 MG tablet Take 0.5 tablets by mouth every 6 (six) hours as needed for severe pain. 03/24/22   Bonnielee Haff, MD  ibuprofen (ADVIL,MOTRIN) 600 MG tablet TAKE 1 TABLET BY MOUTH EVERY 6 HOURS AS NEEDED FOR FEVER,HEADACHE,OR MILD PAIN Patient taking differently: Take 600 mg by mouth every 6 (six) hours as needed for mild pain. 03/07/18   Henson, Laurian Brim, NP-C      Allergies    Patient has no known allergies.    Review of Systems   Review of Systems  Constitutional:  Positive for chills and fever.  HENT:  Positive for dental problem and ear discharge. Negative for trouble swallowing.   Musculoskeletal:  Negative for neck pain and neck stiffness.    Physical Exam Updated Vital Signs BP 126/87   Pulse 79   Temp 97.6 F (36.4 C) (Oral)   Resp 17   Ht 5' 2"  (1.575 m)   Wt 61.2 kg   LMP  (LMP Unknown)   SpO2 100%   BMI 24.69 kg/m  Physical Exam Vitals and nursing note reviewed.  Constitutional:      General: She is not in acute distress.    Appearance: Normal appearance. She is well-developed. She is not ill-appearing or diaphoretic.  HENT:     Head: Normocephalic and atraumatic.     Mouth/Throat:     Comments: There is swelling over  the left mandible and parotid region that is tender and firm to the touch, patient has some trismus, with numerous teeth and poor dentition.  No sublingual tenderness, posterior oropharynx clear and patient is tolerating secretions. Eyes:     General:        Right eye: No discharge.        Left eye: No discharge.     Extraocular Movements: Extraocular movements intact.     Pupils: Pupils are equal, round, and reactive to light.  Neck:     Comments: No tenderness or swelling noted over the neck, no lymphadenopathy Cardiovascular:     Rate and Rhythm: Normal rate and regular rhythm.  Pulmonary:     Effort: Pulmonary effort is normal. No respiratory distress.     Breath sounds: Normal breath sounds. No  stridor. No wheezing, rhonchi or rales.  Abdominal:     General: Bowel sounds are normal.     Palpations: Abdomen is soft.  Musculoskeletal:        General: No deformity.     Cervical back: Neck supple.  Skin:    General: Skin is warm and dry.  Neurological:     Mental Status: She is alert and oriented to person, place, and time.     Coordination: Coordination normal.  Psychiatric:        Mood and Affect: Mood normal.        Behavior: Behavior normal.     ED Results / Procedures / Treatments   Labs (all labs ordered are listed, but only abnormal results are displayed) Labs Reviewed  COMPREHENSIVE METABOLIC PANEL - Abnormal; Notable for the following components:      Result Value   Glucose, Bld 191 (*)    Calcium 10.4 (*)    Total Protein 6.3 (*)    Alkaline Phosphatase 169 (*)    All other components within normal limits  CBC WITH DIFFERENTIAL/PLATELET - Abnormal; Notable for the following components:   WBC 11.2 (*)    RBC 5.61 (*)    MCV 72.4 (*)    MCH 22.6 (*)    Eosinophils Absolute 1.2 (*)    All other components within normal limits    EKG None  Radiology CT Maxillofacial W Contrast  Result Date: 05/17/2022 CLINICAL DATA:  Provided history: Maxillary/facial abscess. Additional history provided: Patient reports left-sided facial swelling for 6 months. EXAM: CT MAXILLOFACIAL WITH CONTRAST TECHNIQUE: Multidetector CT imaging of the maxillofacial structures was performed with intravenous contrast. Multiplanar CT image reconstructions were also generated. RADIATION DOSE REDUCTION: This exam was performed according to the departmental dose-optimization program which includes automated exposure control, adjustment of the mA and/or kV according to patient size and/or use of iterative reconstruction technique. CONTRAST:  28m OMNIPAQUE IOHEXOL 300 MG/ML  SOLN COMPARISON:  Maxillofacial CT 04/28/2022. Maxillofacial CT 03/22/2022. FINDINGS: Osseous: Poor dentition with multiple  absent teeth. Redemonstrated wisdom tooth extraction site within the posterior left mandible. Orbits: No orbital mass or acute orbital finding. Sinuses: Tiny mucous retention cyst within the right maxillary sinus. Small mucous retention cyst within the left maxillary sinus. Mild mucosal thickening within the left frontal sinus. Soft tissues: Persistent left masseter muscle swelling and edema. New from the prior maxillofacial CT of 04/28/2022, there is a 2.1 x 1.0 cm low-attenuation and peripherally enhancing focus within the left masseter muscle, extending from the left mandibular wisdom tooth extraction site. This likely reflects an abscess (for instance as seen on series 3, image 44) (series 7, images 42-49). Limited  intracranial: No evidence of acute intracranial abnormality within the field of view. IMPRESSION: Persistent left masseter muscle swelling and edema consistent with myositis. New from the prior maxillofacial CT of 04/28/2022, there is a 2.1 x 1.0 cm abscess within the left masseter muscle, extending from the left mandibular wisdom tooth extraction site (see series 3, image 44). Mild paranasal sinus disease, as described. Electronically Signed   By: Kellie Simmering D.O.   On: 05/17/2022 08:43    Procedures Procedures    Medications Ordered in ED Medications  iohexol (OMNIPAQUE) 300 MG/ML solution 50 mL (50 mLs Intravenous Contrast Given 05/17/22 0827)    ED Course/ Medical Decision Making/ A&P                           Medical Decision Making  SAMYRIA RUDIE is a 46 y.o. female presents to the ED for concern of dental pain and facial swelling, this involves an extensive number of treatment options, and is a complaint that carries with it a high risk of complications and morbidity.  The differential diagnosis includes dental abscess, cellulitis, pulpitis, masseter myositis, parotitis   Additional history obtained:  Additional history obtained from medical records External records from  outside source obtained and reviewed including prior admission for dental abscess requiring I&D and multiple extractions   Lab Tests:  I Ordered, reviewed, and interpreted labs.  The pertinent results include: Mild leukocytosis, normal hemoglobin, no significant electrolyte derangements and normal renal function   Imaging Studies ordered:  I ordered imaging studies including CT maxillofacial with contrast  I independently visualized and interpreted imaging which showed 2.1 x 1 cm abscess within the left masseter muscle extending from the left mandibular extraction site I agree with the radiologist interpretation    Medicines ordered and prescription drug management:  I ordered medication including IV Flagyl and Rocephin for odontogenic abscess I have reviewed the patients home medicines and have made adjustments as needed    ED Course:  Patient presents with worsening left-sided facial swelling, on exam she has swelling over the parotid region that is tender and firm to palpation as well as some trismus, currently protecting airway.  Had similar presentation in April and required I&D in the OR with oral surgery. Repeat CT shows reaccumulation of abscess within the left masseter muscle.  Will consult oral surgery and patient will likely require admission.   Consultations Obtained:  I requested consultation with the oral surgeon,  and discussed lab and imaging findings as well as pertinent plan -Dr. Conley Simmonds reviewed patient's CT scan, recommends hospital admission for IV antibiotics, he will see patient today in consultation to determine whether she needs to go back to the OR for repeat I&D.  Given multiple recent antibiotic courses, recommends IV Rocephin and Flagyl I requested consultation with medicine for admission, case discussed with internal medicine teaching service who will see and admit the patient    Dispostion:  After consideration of the diagnostic results and the  patients response to treatment feel that the patent would benefit from admission.          Final Clinical Impression(s) / ED Diagnoses Final diagnoses:  Abscess of parotid masseteric region of face    Rx / DC Orders ED Discharge Orders     None         Janet Berlin 05/17/22 1157    Isla Pence, MD 05/17/22 1210

## 2022-05-17 NOTE — ED Notes (Signed)
Patient transported to CT 

## 2022-05-17 NOTE — Consult Note (Signed)
H&P Infection  Exam Date: 05/17/22  ID: The patient is a 51 yoF who presented with pain and swelling of the left masseteric space.  History of Present Illness:  The patient reports having pain and swelling that began in April and presented to [facility] ED then with a left masticator space infection coming from impacted tooth #17 with an associated cystic lesion. She went to the OR on 4/26 for I&D and removal of impacted tooth #17 and the cystic lesion which pathology report identified as a squamous-lined cyst.  She initially showed improvement and was discharged but since then she has had intermittent bouts of pain/swelling in the area and has had multiple courses of Augmentin with temporary benefit. Most recently her swelling and pain progressed and she returned to [facility] ED. CT scan was taken showing an abscess of the left masticator (sub-masseteric) space, there are no signs of osteomyelitis. Since admission to the ED and starting on Rocephin/Flagyl patient reports swelling and pain has started to improve.    Clinical Exam: Extraoral Exam:              Patient is alert, orientated and in mild distress             CN II-XII grossly intact             There is appreciable facial swelling on the left side of the face.              The swelling does not extend into the neck   Intraoral Exam:              The patient does have trismus with maximum incisal opening of 15-20 mm.             The left vestibule is raised.             The floor of mouth is soft and non-elevated             The tongue is not elevated.             There is not lateral pharyngeal swelling with the uvula midline             There is no palatal draping present.             Oral airway is patent                Cardiovascular:  Regular rate and rhythm without any appreciable murmurs, gallops or rubs.               Respiratory: Lungs were clear to auscultation bilaterally without any wheezing or rhonchi.             Abdomen:  Non-distended    Radiographic Exam:                                         A CT of the larynx with contrast was obtained showing fluid collections/cellulitis associated with the left submasseteric space stemming from previous impacted #17. There is no airway deviation.    Assessment: 89 yoF patient with a recurrent submasseteric space infection s/p I&D and removal of #17 and cystic lesion on 03/22/22.    Plan:  The patient will require inpatient admission for IV antibiotics and possible incision and drainage of the infection in the OR.  For now patient reports improvement since starting the  IV antibiotics and will plan to manage infection medically with antibiotic therapy and diabetes control. If symptoms do not improve or worsen will then plan to take to the OR for I&D.   Risks, complications and alternatives of tooth extraction and incision and drainage procedures were discussed and questions were answered.  Among all potential risks and complications, I emphasized the potential for pain, bleeding, swelling, infection, localized alveolar osteitis (dry socket), temporary and permanent lingual and inferior alveolar nerve injury, oroantral (sinus) communication, oronasal communication, jaw fracture, damage to adjacent teeth and tissue, joint discomfort, bone/tooth fragments, recurrence of infection, need for additional procedures, facial nerve injury, scarring, limited mouth opening, drain placement, aspiration and anesthetic mishap.   Herbie Saxon, DDS Oral and Maxillofacial Surgeon [facility] [place]) Office # [phone] Cell # [phone]

## 2022-05-17 NOTE — H&P (Cosign Needed)
Date: 05/17/2022               Patient Name:  Robin Richards MRN: [id number]  DOB: 06/27/1976 Age / Sex: 46 y.o., female   PCP: Sonia Side., FNP         Medical Service: Internal Medicine Teaching Service         Attending Physician: Dr. Lottie Mussel, MD    First Contact: Dr. Lurline Hare Pager: [phone]  Second Contact: Dr. Alfonse Spruce Pager: [phone]       After Hours (After 5p/  First Contact Pager: [phone]  weekends / holidays): Second Contact Pager: [phone]   Chief Complaint: Facial swelling  History of Present Illness:   Robin Richards is a 46 year old female with a past medical history of previous dental abscess s/p I&D (03/22/2022), moderate persistent asthma, prediabetes, who presents to the ED with complaints of facial swelling.  Robin Richards states she began to notice significant left-sided facial swelling approximately 3 days ago that rapidly progressed.  She is experiencing pain in the affected area.  She denies any difficulty swallowing or breathing.  She denies any fever, chills, throat pain or swelling, nausea, vomiting, diarrhea, chest pain, palpitations.  She recently visited her PCP for this approximately day and a half ago, whom started her on amoxicillin.  Despite this, her symptoms continue to progress.  Per chart review, patient had a dental infection complicated by abscess and facial cellulitis on 4/26 that required I&D.  She was discharged on Augmentin.  She states her symptoms significantly improved after that hospitalization.  Then on 04/28/2022, she visited the ED with complaints of left-sided facial swelling that was significantly more mild compared to what she is experiencing now.  At that time, CT maxillofacial demonstrated myositis consistent with inflammation, but no evidence of abscess or osteomyelitis.  She was discharged home on Augmentin at that time as well.  Her symptoms initially improved before recurring.  ED Course:  On arrival to the ED, patient was  normotensive at 121/75 with a heart rate of 89.  She was saturating at 100% on room air.  She was afebrile at 46 Fahrenheit.  Initial blood work demonstrated leukocytosis at 11.2, hypercalcemia 10.4, hyperglycemia at 191, and elevated alk phos at 169.  CT maxillofacial was repeated that demonstrated new 2.1X 1.0 cm abscess in the left masseter muscle extending from the left mandibular wisdom tooth extraction site.  Oral surgery was consulted.  IMTS was consulted for admission.  Meds:  No current facility-administered medications on file prior to encounter.   Current Outpatient Medications on File Prior to Encounter  Medication Sig Dispense Refill   acetaminophen (TYLENOL) 500 MG tablet Take 500 mg by mouth every 6 (six) hours as needed for mild pain.     albuterol (PROVENTIL HFA;VENTOLIN HFA) 108 (90 Base) MCG/ACT inhaler Inhale 1-2 puffs into the lungs every 6 (six) hours as needed for wheezing or shortness of breath. 3 Inhaler 0   atorvastatin (LIPITOR) 20 MG tablet Take 20 mg by mouth daily.     HYDROcodone-acetaminophen (NORCO) 10-325 MG tablet Take 0.5 tablets by mouth every 6 (six) hours as needed for severe pain. 15 tablet 0   ibuprofen (ADVIL,MOTRIN) 600 MG tablet TAKE 1 TABLET BY MOUTH EVERY 6 HOURS AS NEEDED FOR FEVER,HEADACHE,OR MILD PAIN (Patient taking differently: Take 600 mg by mouth every 6 (six) hours as needed for mild pain.) 30 tablet 1   amoxicillin-clavulanate (AUGMENTIN) 875-125 MG tablet Take 1 tablet  by mouth every 12 (twelve) hours. (Patient not taking: Reported on 05/17/2022) 14 tablet 0   chlorhexidine (PERIDEX) 0.12 % solution Use as directed 15 mLs in the mouth or throat 4 (four) times daily. (Patient not taking: Reported on 05/17/2022) 473 mL 0   Allergies: Allergies as of 05/16/2022   (No Known Allergies)   Past Medical History:  Diagnosis Date   Arthritis    Asthma    Elevated hemoglobin A1c    Heartburn    Scoliosis    Family History:  Family History   Problem Relation Age of Onset   Cirrhosis Mother    Cancer Father    Hypertension Sister    Thyroid disease Sister    Breast cancer Maternal Grandmother   Diabetes - sister  Social History:  - Currently lives in [place] - Unemployed - Currently smokes 1 to 2 cigarettes/day after cutting down significantly - No recent illicit substance use - No recent alcohol use - PCP located at [facility] clinic  Review of Systems: A complete ROS was negative except as per HPI.   Physical Exam: Blood pressure 135/87, pulse 60, temperature 97.6 F (36.4 C), temperature source Oral, resp. rate 18, height 5' 2"  (1.575 m), weight 61.2 kg, SpO2 98 %.  Physical Exam Vitals and nursing note reviewed.  Constitutional:      General: She is not in acute distress.    Appearance: She is obese.  HENT:     Head: Atraumatic.     Comments: Left cheek with marked swelling, with area of induration approximately 3X 2 cm.  Significant tenderness to palpation overlying the entire lower half of the left face.  No abnormalities on the right side of the face.    Mouth/Throat:     Mouth: Mucous membranes are moist.     Dentition: Dental caries present. No gingival swelling or dental abscesses.     Pharynx: Oropharynx is clear.     Comments: No pooling of oral secretions secretions Eyes:     Extraocular Movements: Extraocular movements intact.     Conjunctiva/sclera: Conjunctivae normal.     Pupils: Pupils are equal, round, and reactive to light.  Cardiovascular:     Rate and Rhythm: Normal rate and regular rhythm.     Heart sounds: No murmur heard.    No gallop.  Pulmonary:     Effort: Pulmonary effort is normal. No respiratory distress.     Breath sounds: Wheezing (Diffuse expiratory wheezing heard throughout) present. No rhonchi or rales.  Abdominal:     General: Bowel sounds are normal. There is no distension.     Palpations: Abdomen is soft.     Tenderness: There is no abdominal tenderness. There  is no guarding.  Musculoskeletal:     Right lower leg: No edema.     Left lower leg: No edema.     Comments: Significant scoliosis  Skin:    General: Skin is warm and dry.  Neurological:     General: No focal deficit present.     Mental Status: She is alert and oriented to person, place, and time. Mental status is at baseline.     Cranial Nerves: No cranial nerve deficit.  Psychiatric:        Mood and Affect: Mood normal.        Behavior: Behavior normal.        Thought Content: Thought content normal.        Judgment: Judgment normal.    Assessment &  Plan by Problem: Principal Problem:   Abscess of parotid masseteric region of face  Ms. Robin Richards is a 46 year old female with a past medical history of recent dental abscess complicated by facial cellulitis s/p I&D, prediabetes, moderate persistent asthma, who presents today with left facial swelling and admitted for left masseter abscess.  # Left Masseter Abscess  3-day history of rapidly progressing left-sided facial swelling with area of induration palpated on examination.  CT imaging with evidence of new abscess compared to CT imaging on 6/2.  No evidence of systemic infection at this time.  Patient is able to protect her airway and is not experiencing any difficulty controlling her secretions.  Given patient has completed multiple rounds of Augmentin with persistence/recurrence of symptoms, will start her on Rocephin and Flagyl.  - Oral surgery consulted; appreciate their recommendations - Rocephin 2 g daily - Flagyl 500 mg 3 times daily - Tylenol as needed for fever - Norco as needed for pain  # Moderate Persistent Asthma  Per chart review, patient has a history of moderate persistent asthma, and is not currently on any maintenance treatment.  On examination, she has significant wheezing throughout but is not experiencing any shortness of breath or hypoxia.  Low suspicion this is an active asthma exacerbation at this  time.  - Start ICS-LABA, Dulera twice daily - Monitor pulse oximetry when obtaining vitals  # Type 2 Diabetes Mellitus  Per chart, patient has a past medical history of prediabetes with last A1c of 6.4%.  Given hyperglycemia on admission, A1c today was obtained and demonstrates elevation at 8.4%.  Prior to discharge, will need to start on metformin.  - SSI, moderate  Diet: NPO VTE: SCDs IVF: None,None Code: Full  Prior to Admission Living Arrangement: Home Anticipated Discharge Location: Home Barriers to Discharge: IV antibiotics +/- surgery  Dispo: Admit patient to Inpatient with expected length of stay greater than 2 midnights.  Signed: Dr. Jose Persia Internal Medicine PGY-3 Pager: [phone] After 5pm on weekdays and 1pm on weekends: On Call pager [phone]  05/17/2022, 3:10 PM

## 2022-05-17 NOTE — ED Notes (Signed)
Pt has 3+, swelling of left side of face. Pt is able to maintain oral secretions

## 2022-05-18 ENCOUNTER — Other Ambulatory Visit (HOSPITAL_COMMUNITY): Payer: Self-pay

## 2022-05-18 DIAGNOSIS — L0201 Cutaneous abscess of face: Secondary | ICD-10-CM

## 2022-05-18 LAB — CBC WITH DIFFERENTIAL/PLATELET
Abs Immature Granulocytes: 0.03 10*3/uL (ref 0.00–0.07)
Basophils Absolute: 0.1 10*3/uL (ref 0.0–0.1)
Basophils Relative: 1 %
Eosinophils Absolute: 1.1 10*3/uL — ABNORMAL HIGH (ref 0.0–0.5)
Eosinophils Relative: 11 %
HCT: 40.9 % (ref 36.0–46.0)
Hemoglobin: 12.9 g/dL (ref 12.0–15.0)
Immature Granulocytes: 0 %
Lymphocytes Relative: 27 %
Lymphs Abs: 2.7 10*3/uL (ref 0.7–4.0)
MCH: 22.6 pg — ABNORMAL LOW (ref 26.0–34.0)
MCHC: 31.5 g/dL (ref 30.0–36.0)
MCV: 71.5 fL — ABNORMAL LOW (ref 80.0–100.0)
Monocytes Absolute: 0.8 10*3/uL (ref 0.1–1.0)
Monocytes Relative: 8 %
Neutro Abs: 5.2 10*3/uL (ref 1.7–7.7)
Neutrophils Relative %: 53 %
Platelets: 257 10*3/uL (ref 150–400)
RBC: 5.72 MIL/uL — ABNORMAL HIGH (ref 3.87–5.11)
RDW: 15.3 % (ref 11.5–15.5)
WBC: 9.8 10*3/uL (ref 4.0–10.5)
nRBC: 0 % (ref 0.0–0.2)

## 2022-05-18 LAB — GLUCOSE, CAPILLARY: Glucose-Capillary: 166 mg/dL — ABNORMAL HIGH (ref 70–99)

## 2022-05-18 LAB — BASIC METABOLIC PANEL
Anion gap: 10 (ref 5–15)
BUN: 11 mg/dL (ref 6–20)
CO2: 21 mmol/L — ABNORMAL LOW (ref 22–32)
Calcium: 10 mg/dL (ref 8.9–10.3)
Chloride: 105 mmol/L (ref 98–111)
Creatinine, Ser: 0.67 mg/dL (ref 0.44–1.00)
GFR, Estimated: >60 mL/min (ref >60)
Glucose, Bld: 120 mg/dL — ABNORMAL HIGH (ref 70–99)
Potassium: 3.8 mmol/L (ref 3.5–5.1)
Sodium: 136 mmol/L (ref 135–145)

## 2022-05-18 MED ORDER — CEFDINIR 300 MG PO CAPS
300.0000 mg | ORAL_CAPSULE | Freq: Two times a day (BID) | ORAL | 0 refills | Status: AC
  Filled 2022-05-18: qty 16, 8d supply, fill #0

## 2022-05-18 MED ORDER — METRONIDAZOLE 500 MG PO TABS
500.0000 mg | ORAL_TABLET | Freq: Three times a day (TID) | ORAL | 0 refills | Status: AC
  Filled 2022-05-18: qty 24, 8d supply, fill #0

## 2022-05-18 MED ORDER — CARVEDILOL 25 MG PO TABS: 25.0000 mg | ORAL_TABLET | Freq: Two times a day (BID) | ORAL | Status: DC

## 2022-05-18 NOTE — Progress Notes (Signed)
  Transition of Care [facility]) Screening Note   Patient Details  Name: BERNITA BECKSTROM Date of Birth: 1976/01/02   [facility]) CM/SW Contact:    Cyndi Bender, RN Phone Number: 05/18/2022, 8:38 AM    Transition of Care Department [facility]) has reviewed patient and no TOC needs have been identified at this time. We will continue to monitor patient advancement through interdisciplinary progression rounds. If new patient transition needs arise, please place a TOC consult.

## 2022-05-18 NOTE — Progress Notes (Signed)
Progress Note - Infection  ID: 3 yoF patient with a recurrent/persistent submasseteric space infection s/p I&D and removal of #17 and cystic lesion on 03/22/22 readmitted on 05/17/22 for management of infection.  Interval History 6/22 - Patient reports feeling continued improvement since admission yesterday on IV antibiotics. On exam swelling is slightly decreased and area less tender. Mouth opening is also slightly improved.     Clinical Exam:             There is appreciable facial swelling on the left side of the face, slightly decreased compared to yesterday              The swelling does not extend into the neck             The patient does have trismus with maximum incisal opening of 25 mm.             The left vestibule is raised.             The floor of mouth is soft and non-elevated             The tongue is not elevated.             There is not lateral pharyngeal swelling with the uvula midline             There is no palatal draping present.             Oral airway is patent       Assessment: 28 yoF patient with a recurrent/persistent submasseteric space infection s/p I&D and removal of #17 and cystic lesion on 03/22/22 readmitted on 05/17/22 for management of infection.   Plan:   -No plan for I&D in the OR due to patient subjective and objective improvement to medical management; patient does not need to remain NPO -Continue diabetes optimization/BS control and medical management -Continue Rocephin/Flagyl, patient appears to be responding to this antibiotics combination  -Defer to medicine for discharge timeline and antibiotic regimen; patient has an appointment in a few weeks to follow up with her local dentist for other dental extractions and this can be followed up with at that time or patient can call my office prn if issues arise; I will sign off at this time please contact me with questions or concerns   Terese Door, DDS Oral and Maxillofacial Surgeon [facility] ([place]) Office # [phone] Cell # [phone]

## 2022-05-18 NOTE — Discharge Summary (Addendum)
Name: Robin Richards MRN: [id number] DOB: 1976-10-17 46 y.o. PCP: Sonia Side., FNP  Date of Admission: 05/16/2022  9:10 PM Date of Discharge: 05/18/2022 Attending Physician: Lottie Mussel, MD  Discharge Diagnosis: 1.  Left masseter abscess 2.  Type 2 diabetes 3.  Moderate persistent asthma 4.  Hyperlipidemia  Discharge Medications: Allergies as of 05/18/2022   No Known Allergies      Medication List     STOP taking these medications    amoxicillin-clavulanate 875-125 MG tablet Commonly known as: AUGMENTIN   chlorhexidine 0.12 % solution Commonly known as: PERIDEX       TAKE these medications    acetaminophen 500 MG tablet Commonly known as: TYLENOL Take 500 mg by mouth every 6 (six) hours as needed for mild pain.   albuterol 108 (90 Base) MCG/ACT inhaler Commonly known as: VENTOLIN HFA Inhale 1-2 puffs into the lungs every 6 (six) hours as needed for wheezing or shortness of breath.   atorvastatin 20 MG tablet Commonly known as: LIPITOR Take 20 mg by mouth daily.   cefdinir 300 MG capsule Commonly known as: OMNICEF Take 1 capsule (300 mg total) by mouth 2 (two) times daily for 8 days.   HYDROcodone-acetaminophen 10-325 MG tablet Commonly known as: NORCO Take 0.5 tablets by mouth every 6 (six) hours as needed for severe pain.   ibuprofen 600 MG tablet Commonly known as: ADVIL TAKE 1 TABLET BY MOUTH EVERY 6 HOURS AS NEEDED FOR FEVER,HEADACHE,OR MILD PAIN What changed: See the new instructions.   metroNIDAZOLE 500 MG tablet Commonly known as: Flagyl Take 1 tablet (500 mg total) by mouth 3 (three) times daily for 8 days.        Disposition and follow-up:   Robin Richards was discharged from [facility] in Stable condition.  At the hospital follow up visit please address:  1.   Left masseter abscess -Ensure completion of 10-day course of antibiotic.  She received 2 days of IV ceftriaxone/Flagyl and continue 8 more days of  Augmentin. -Follow-up with dentist in July -Assess for symptoms including airway and swallowing at follow-up.  Type 2 Diabetes Mellitus -A1c 8.4 -Instruct patient on better diabetes control with medication and lifestyle changes (possibly GLP1 agonist)  2.  Labs / imaging needed at time of follow-up: CBC. BMP  3.  Pending labs/ test needing follow-up:  NA  Follow-up Appointments:  -Follow-up with PCP in 1-2 weeks -Follow-up with dentist in July  Hospital Course by problem list:  Robin Richards is a 46 year old female with past medical history of prior dental abscess s/p I&D (03/22/2022), moderate persistent asthma, prediabetes, who presents to the ED with complaints of facial swelling.  She report left-sided facial swelling 3 days prior to admission that rapidly progressed.  She did not have any airway obstruction or difficulty swallowing symptoms.  She was given amoxicillin by her PCP 1 day prior to admission but symptoms continued to get worse.  Patient has a prior dental abscess in April 2023 that required I&D.  She completed a course of Augmentin.  Patient then presented to the emergency room on 04/28/2022 for worsening of facial swelling.  CT imaging at that time did not reveal an abscess.  She was discharged with another course of Augmentin.  Oral surgeon was consulted during this admission and has evaluated patient.  Given the improvement of his symptoms without any serious complications such as airway obstruction, they do not recommend I&D at this time.  Patient  tolerated oral diet well on the day of discharge.  Patient received 2 days of Rocephin and Flagyl in the hospital and will continue 8 more days of cefdinir and metronidazole after discharge.  She will follow-up with her dentist in a few weeks.  Discharge Exam:   BP 138/85 (BP Location: Left Arm)   Pulse 79   Temp 98.2 F (36.8 C) (Oral)   Resp 16   Ht 5' 2"  (1.575 m)   Wt 61.2 kg   LMP  (LMP Unknown)   SpO2 98%   BMI  24.69 kg/m  Discharge exam:  Constitutional:      General: She is not in acute distress.    Appearance: She is obese.  HENT:     Head: Atraumatic.     Comments: Left cheek with improved swelling swelling, with less induration. Mild pain on palpation but improved forom yesterday.  No abnormalities on the right side of the face.    Mouth/Throat:     Mouth: Mucous membranes are moist.     Dentition: Dental caries present. No gingival swelling or dental abscesses.     Pharynx: Oropharynx is clear.     Comments: No pooling of oral secretions Eyes:     Extraocular Movements: Extraocular movements intact.     Conjunctiva/sclera: Conjunctivae normal.     Pupils: Pupils are equal, round, and reactive to light.  Cardiovascular:     Rate and Rhythm: Normal rate and regular rhythm.     Heart sounds: No murmur heard.    No gallop.  Pulmonary:     Effort: Pulmonary effort is normal. No respiratory distress.     Breath sounds: Wheezing (Diffuse expiratory wheezing heard particularly in RLQ) present. No rhonchi or rales.  Abdominal:     General: Bowel sounds are normal. There is no distension.     Palpations: Abdomen is soft.     Tenderness: There is no abdominal tenderness. There is no guarding.  Musculoskeletal:     Right lower leg: No edema.     Left lower leg: No edema.     Comments: Significant scoliosis  Skin:    General: Skin is warm and dry.  Neurological:     General: No focal deficit present.     Mental Status: She is alert and oriented to person, place, and time. Mental status is at baseline.     Cranial Nerves: No cranial nerve deficit.  Psychiatric:        Mood and Affect: Mood normal.        Behavior: Behavior normal.        Thought Content: Thought content normal.        Judgment: Judgment normal.    Pertinent Labs, Studies, and Procedures:     Latest Ref Rng & Units 05/18/2022    2:46 AM 05/16/2022    9:43 PM 04/28/2022   11:37 AM  CBC  WBC 4.0 - 10.5 K/uL 9.8  11.2   8.0   Hemoglobin 12.0 - 15.0 g/dL 12.9  12.7  13.0   Hematocrit 36.0 - 46.0 % 40.9  40.6  41.3   Platelets 150 - 400 K/uL 257  281  280       Latest Ref Rng & Units 05/18/2022    2:46 AM 05/16/2022    9:43 PM 04/28/2022   11:37 AM  CMP  Glucose 70 - 99 mg/dL 120  191  145   BUN 6 - 20 mg/dL 11  12  9  Creatinine 0.44 - 1.00 mg/dL 0.67  0.89  0.58   Sodium 135 - 145 mmol/L 136  139  138   Potassium 3.5 - 5.1 mmol/L 3.8  4.5  4.4   Chloride 98 - 111 mmol/L 105  106  106   CO2 22 - 32 mmol/L 21  24  25    Calcium 8.9 - 10.3 mg/dL 10.0  10.4  10.2   Total Protein 6.5 - 8.1 g/dL  6.3  6.4   Total Bilirubin 0.3 - 1.2 mg/dL  0.6  0.5   Alkaline Phos 38 - 126 U/L  169  155   AST 15 - 41 U/L  17  16   ALT 0 - 44 U/L  15  19    CT Maxillofacial W Contrast  Result Date: 05/17/2022 CLINICAL DATA:  Provided history: Maxillary/facial abscess. Additional history provided: Patient reports left-sided facial swelling for 6 months. EXAM: CT MAXILLOFACIAL WITH CONTRAST TECHNIQUE: Multidetector CT imaging of the maxillofacial structures was performed with intravenous contrast. Multiplanar CT image reconstructions were also generated. RADIATION DOSE REDUCTION: This exam was performed according to the departmental dose-optimization program which includes automated exposure control, adjustment of the mA and/or kV according to patient size and/or use of iterative reconstruction technique. CONTRAST:  57m OMNIPAQUE IOHEXOL 300 MG/ML  SOLN COMPARISON:  Maxillofacial CT 04/28/2022. Maxillofacial CT 03/22/2022. FINDINGS: Osseous: Poor dentition with multiple absent teeth. Redemonstrated wisdom tooth extraction site within the posterior left mandible. Orbits: No orbital mass or acute orbital finding. Sinuses: Tiny mucous retention cyst within the right maxillary sinus. Small mucous retention cyst within the left maxillary sinus. Mild mucosal thickening within the left frontal sinus. Soft tissues: Persistent left masseter  muscle swelling and edema. New from the prior maxillofacial CT of 04/28/2022, there is a 2.1 x 1.0 cm low-attenuation and peripherally enhancing focus within the left masseter muscle, extending from the left mandibular wisdom tooth extraction site. This likely reflects an abscess (for instance as seen on series 3, image 44) (series 7, images 42-49). Limited intracranial: No evidence of acute intracranial abnormality within the field of view. IMPRESSION: Persistent left masseter muscle swelling and edema consistent with myositis. New from the prior maxillofacial CT of 04/28/2022, there is a 2.1 x 1.0 cm abscess within the left masseter muscle, extending from the left mandibular wisdom tooth extraction site (see series 3, image 44). Mild paranasal sinus disease, as described. Electronically Signed   By: KKellie SimmeringD.O.   On: 05/17/2022 08:43       Latest Ref Rng & Units 05/18/2022    2:46 AM 05/16/2022    9:43 PM 04/28/2022   11:37 AM  CBC  WBC 4.0 - 10.5 K/uL 9.8  11.2  8.0   Hemoglobin 12.0 - 15.0 g/dL 12.9  12.7  13.0   Hematocrit 36.0 - 46.0 % 40.9  40.6  41.3   Platelets 150 - 400 K/uL 257  281  280       Latest Ref Rng & Units 05/18/2022    2:46 AM 05/16/2022    9:43 PM 04/28/2022   11:37 AM  BMP  Glucose 70 - 99 mg/dL 120  191  145   BUN 6 - 20 mg/dL 11  12  9    Creatinine 0.44 - 1.00 mg/dL 0.67  0.89  0.58   Sodium 135 - 145 mmol/L 136  139  138   Potassium 3.5 - 5.1 mmol/L 3.8  4.5  4.4   Chloride 98 - 111 mmol/L  105  106  106   CO2 22 - 32 mmol/L 21  24  25    Calcium 8.9 - 10.3 mg/dL 10.0  10.4  10.2     Discharge Instructions: Discharge Instructions     Call MD for:  difficulty breathing, headache or visual disturbances   Complete by: As directed    Call MD for:  persistant nausea and vomiting   Complete by: As directed    Call MD for:  severe uncontrolled pain   Complete by: As directed    Call MD for:  temperature >100.4   Complete by: As directed    Diet - low sodium  heart healthy   Complete by: As directed    Discharge instructions   Complete by: As directed    Robin Richards,  It was a pleasure taking care of you during this admission.  You were hospitalized for a small abscess on the left side of your mouth.  I am glad that that the swelling has improved with IV antibiotic.  We will send you home with 2 antibiotics Cefdinir and Metronidazole.  Please take them as instructed.  Please follow-up with your dentist as scheduled.  I also have the phone number of the oral surgeon's office below.  Please call them if you have any question or concern.  [facility] ([place]) Office # [phone]  Please follow-up with your primary care doctor in 1-2 weeks.  Take care  Dr. Alfonse Spruce   Increase activity slowly   Complete by: As directed        Signed: France Ravens, MD 05/18/2022, 11:24 AM   Pager: [phone]      Internal Medicine Attending:   I saw and examined the patient. I reviewed the resident's note and I agree with the resident's findings and plan as documented in the resident's note.    Since patient has failed Augmentin outpatient treatment and did well on Ceftriaxone/Flagyl, we will discharge her with a 10-day course of antibiotics (8 days remaining) with Cefdinir & Flagyl

## 2023-04-24 IMAGING — DX DG ORTHOPANTOGRAM /PANORAMIC
1 series · 1 of 1 positions shown · non-contrast
Comparison: CT maxillofacial, same date.

CLINICAL DATA: Left-sided facial swelling. Recent tooth
extractions.

EXAM:
ORTHOPANTOGRAM/PANORAMIC

[view not recorded]
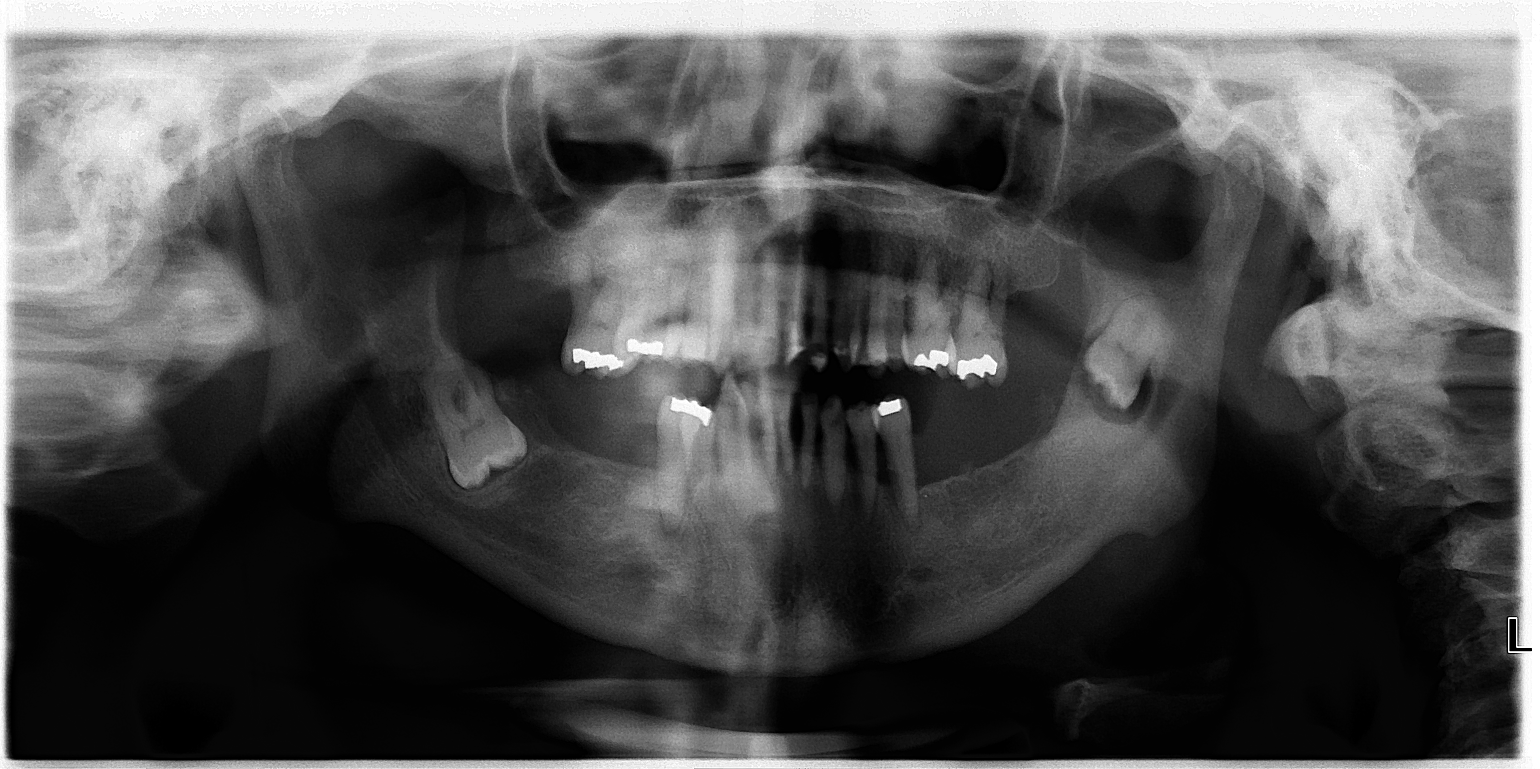

[1 of 1 positions shown; findings below may reference images not displayed]

FINDINGS: As demonstrated on the CT scan there is lucency surrounding and
impacted left mandibular wisdom tooth with cortical erosion. The
associated soft tissue masses better demonstrated on the CT scan.

There is also an impacted right mandibular wisdom tooth but no
surrounding lucency. The other remaining teeth appear to be intact.
IMPRESSION: 1. Impacted left mandibular wisdom tooth with surrounding lucency
and cortical destruction better demonstrated on the CT scan.
2. Impacted right mandibular wisdom tooth without surrounding
lucency.
3. The other teeth are intact.

## 2023-04-24 IMAGING — CT CT MAXILLOFACIAL W/ CM
3 of 6 series · 14 of 47 positions shown, 17 images · IV contrast (APPLIED)
Comparison: None.

CLINICAL DATA: 45-year-old female with possible facial abscess.

EXAM:
CT MAXILLOFACIAL WITH CONTRAST
TECHNIQUE: Multidetector CT imaging of the maxillofacial structures was
performed with intravenous contrast. Multiplanar CT image
reconstructions were also generated.

[Series 3: maxilllofacial 2.0 hr40 3 · axial · 0.39mm/px · z∈[-254,-112]mm · 9 of 83 slices shown, 12 images]
[im 6/83  brain]
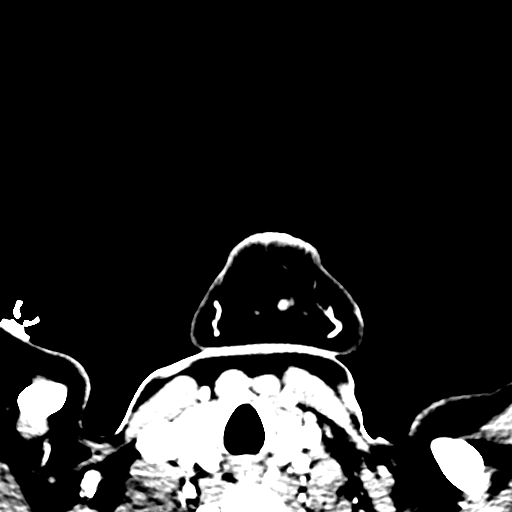
[im 6/83  bone]
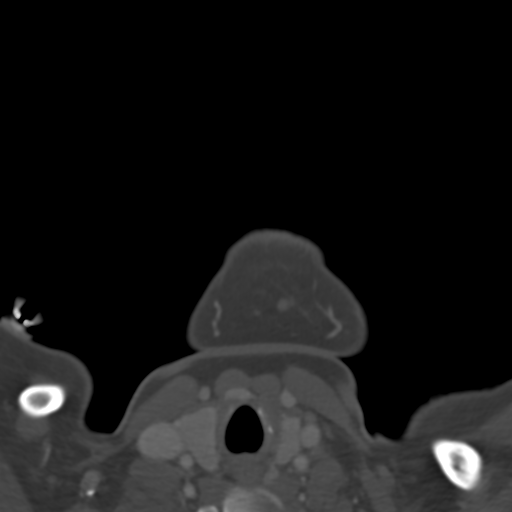
[im 18/83  bone]
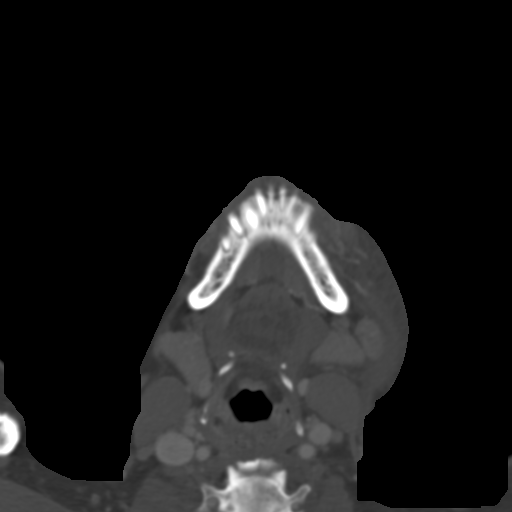
[im 24/83  bone]
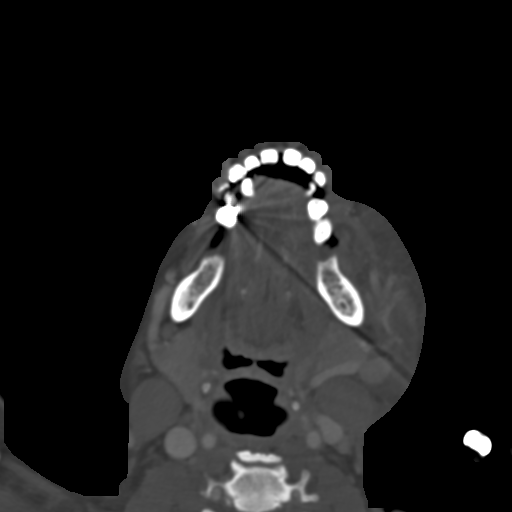
[im 36/83  bone]
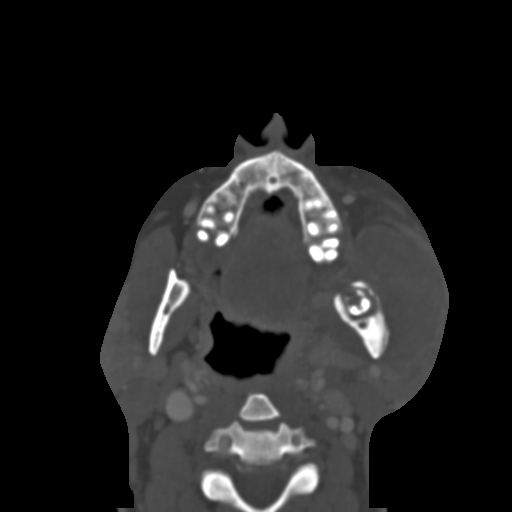
[im 42/83  brain]
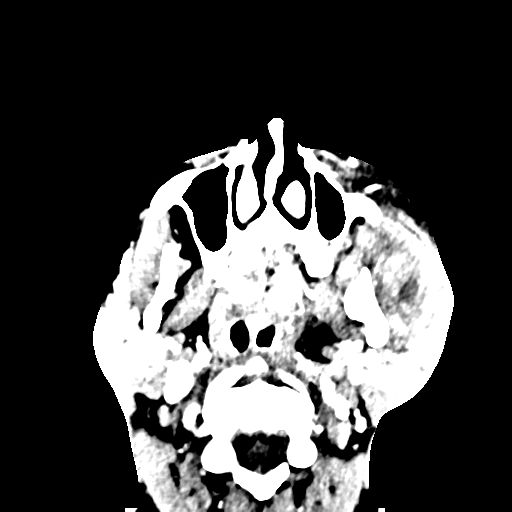
[im 42/83  bone]
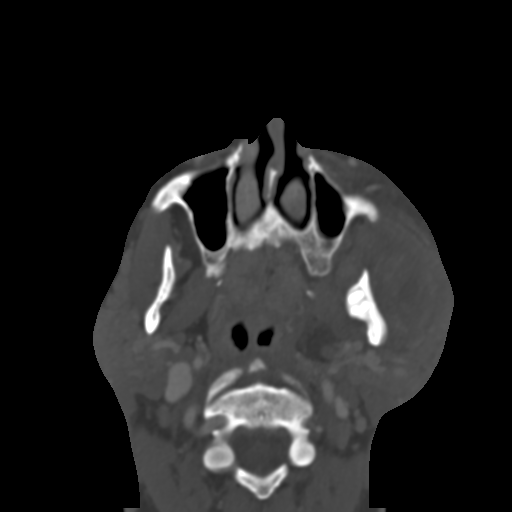
[im 47/83  bone]
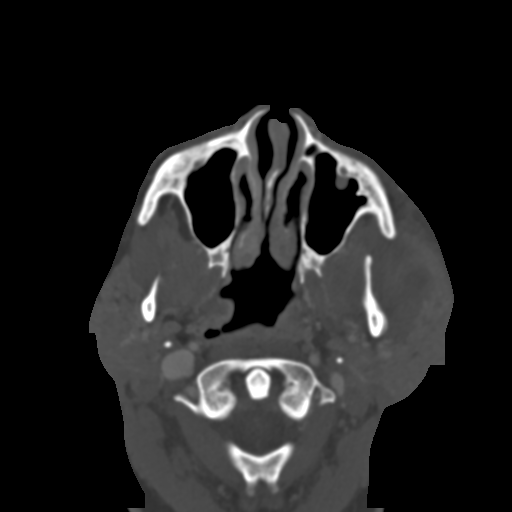
[im 59/83  bone]
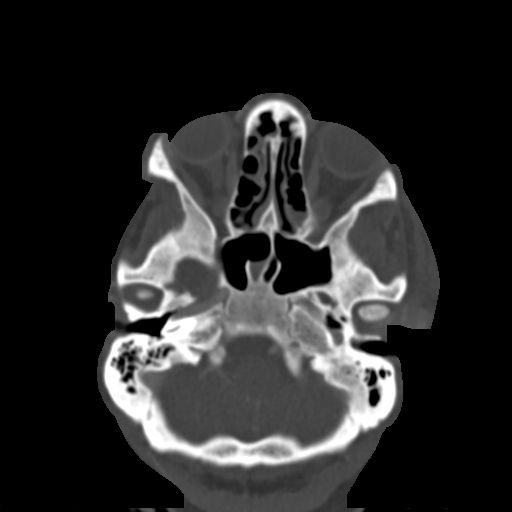
[im 65/83  bone]
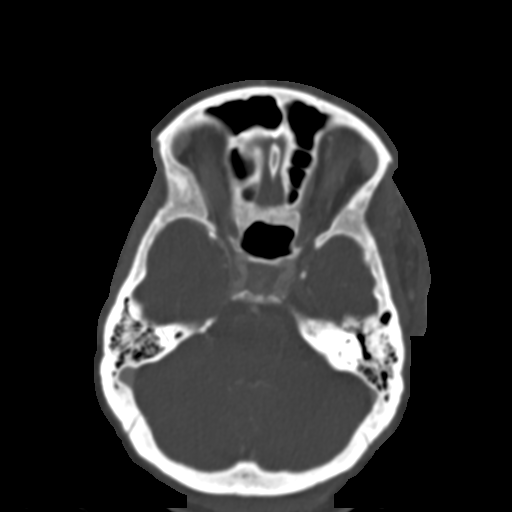
[im 77/83  brain]
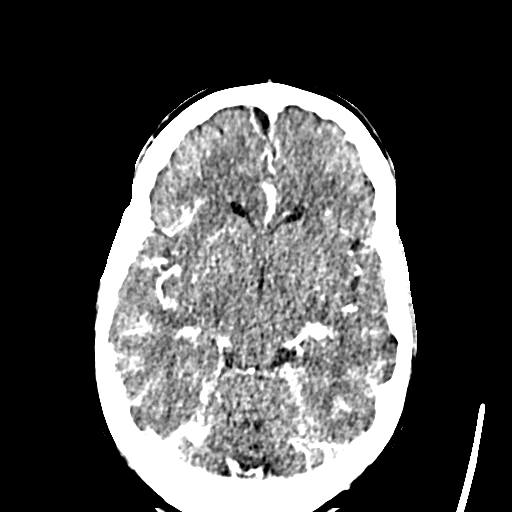
[im 77/83  bone]
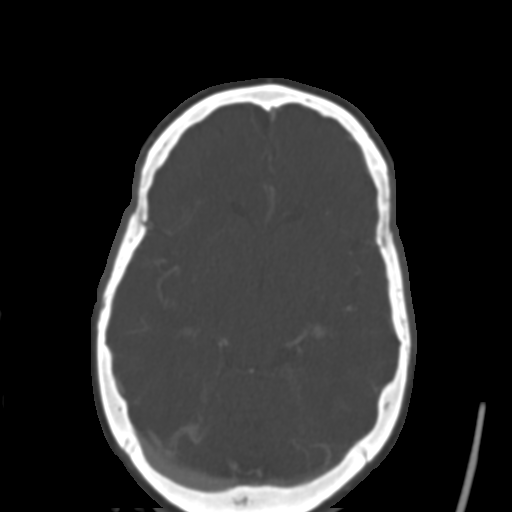

[Series 7: st cor · coronal · 0.33mm/px · 3 of 95 slices shown]
[im 24/95  bone]
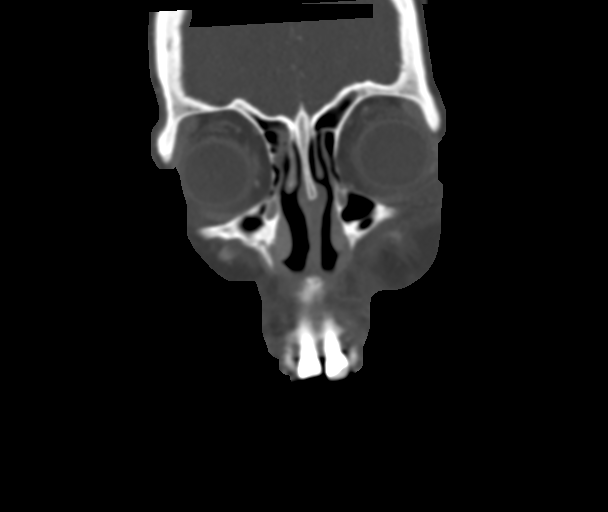
[im 48/95  bone]
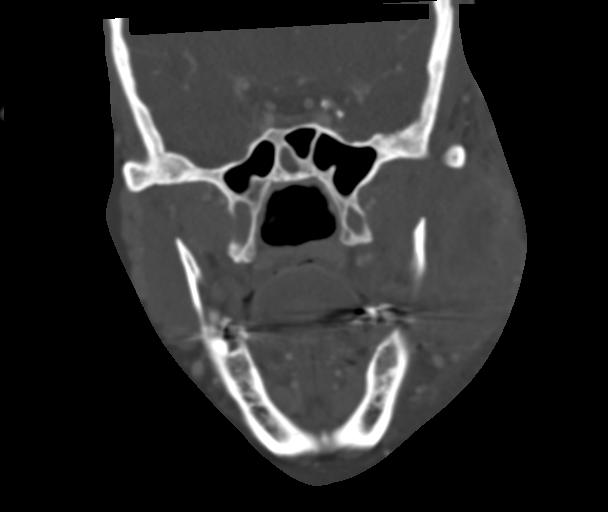
[im 71/95  bone]
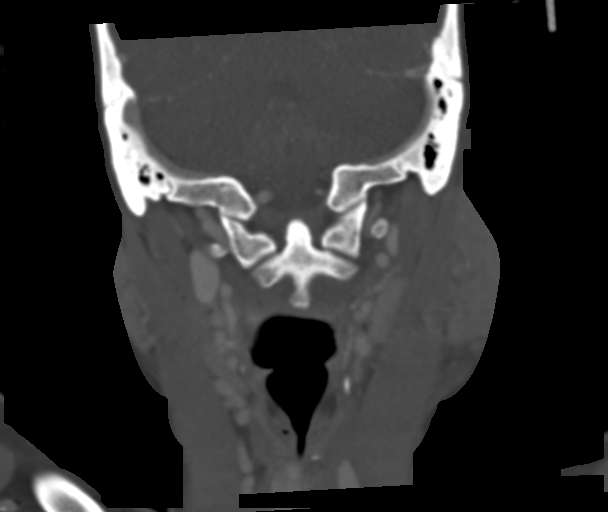

[Series 10: bone sag · sagittal · 0.31mm/px · 2 of 87 slices shown]
[im 29/87  bone]
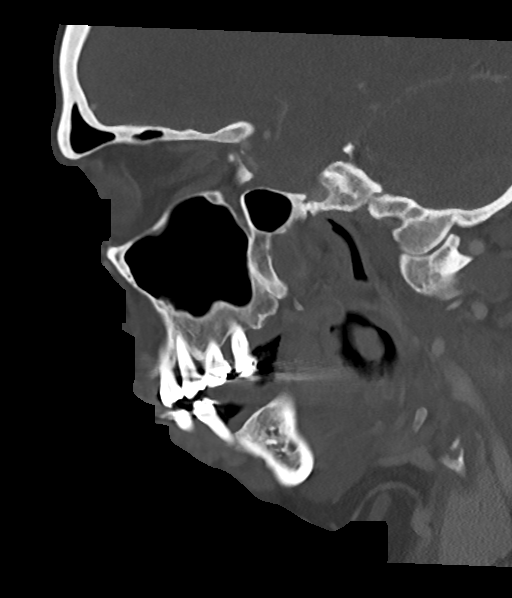
[im 58/87  bone]
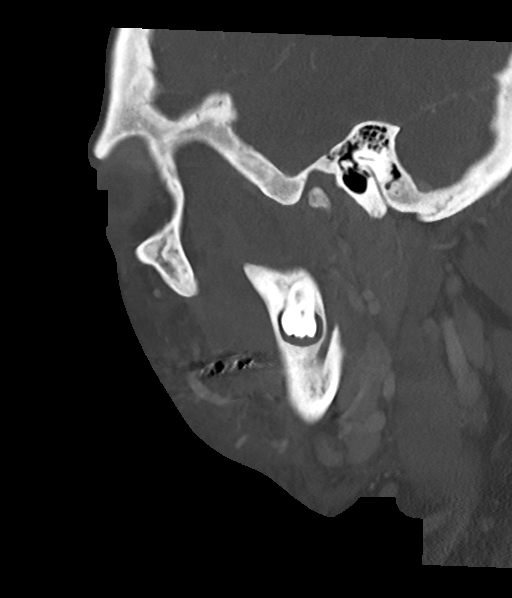

[14 of 47 positions shown; findings below may reference images not displayed]

RADIATION DOSE REDUCTION: This exam was performed according to the
departmental dose-optimization program which includes automated
exposure control, adjustment of the mA and/or kV according to
patient size and/or use of iterative reconstruction technique.

CONTRAST:  100mL OMNIPAQUE IOHEXOL 300 MG/ML  SOLN
FINDINGS: Osseous: Un-erupted mandible wisdom teeth, with a left-side
associated dentigerous cyst (series 10, image 57). But there is
associated broad-based dehiscence of bone in an area of 5 mm at the
lateral mandible body there (series 5, image 37) and diffusely
enlarged and heterogeneous left masticator space in that region.
Linear relatively hyperdense material extends through the bony
defect in the masticator space on series 7, image 55, and associated
masseter muscle enlargement in an area of 45 x 27 x 48 mm (AP by
transverse by CC) is mostly intermediate to high density. There is a
small 14 mm internal component of lower density (series 7, image 52)
but this does not reach simple fluid density.

Associated left facial swelling and subcutaneous stranding, mass
effect on the left parotid space without evidence of invasion
outside of the masticator space. No soft tissue gas.

Other dental periapical lucency especially bilateral anterior
mandible bicuspids. Left maxillary medial incisor root canal
changes.

No other No acute osseous abnormality identified. Partially visible
bulky anterior cervical spine endplate osteophytes.

Orbits: Intact orbital walls. Orbits soft tissues appears symmetric
and within normal limits.

Sinuses: Only mild scattered paranasal sinus mucosal thickening.
Tympanic cavities and mastoids are aerated.

Soft tissues: Abnormal left masticator space and overlying facial
soft tissues as above.

Superimposed visible thyroid, larynx, pharynx, retropharyngeal
space, sublingual space, right parapharyngeal, right submandibular,
and right parotid spaces are within normal limits.

There is mild inflammation in the left parapharyngeal space. And
there is thickening of the left platysma with edema tracking
superficial to the left submandibular space.

There are small but reactive appearing left level 1B and level 2
lymph nodes up to 8-9 mm short axis (series 3, image 19).

The major vascular structures in the neck and at the skull base
remain patent with dominant right IJ. Dominant right vertebral
artery.

Limited intracranial: Negative.
IMPRESSION: Marked heterogeneous enlargement of the lower left masticator space
and left masseter muscle appears related to sequelae of impacted
left mandible wisdom tooth dentigerous cyst which has eroded out
from the posterior mandible body.
The 4-5 cm expansion of the muscle is heterogeneous and mostly
hyperdense, more resembling soft tissue tumor than a complicated
cyst or abscess.
But there is regional soft tissue inflammation in both the
subcutaneous and left parapharyngeal spaces, and regional reactive
appearing lymph nodes.
Recommend OMFS consultation.

## 2023-07-10 ENCOUNTER — Ambulatory Visit (HOSPITAL_COMMUNITY)
Admission: EM | Admit: 2023-07-10 | Discharge: 2023-07-10 | Disposition: A | Discharge status: Home / Self Care | Payer: 59 | Attending: Emergency Medicine | Admitting: Emergency Medicine

## 2023-07-10 ENCOUNTER — Encounter (HOSPITAL_COMMUNITY): Payer: Self-pay

## 2023-07-10 DIAGNOSIS — T161XXA Foreign body in right ear, initial encounter: Secondary | ICD-10-CM

## 2023-07-10 NOTE — Discharge Instructions (Addendum)
I was able to remove the entire hearing aid cover.  Your ear canal and eardrum look good.  You can use Debrox in the future to prevent wax buildup. you can also try a few drops of a 50/50 mix of white vinegar and rubbing alcohol when you take out your hearing aids which will dry out your ear and acidify it.

## 2023-07-10 NOTE — ED Triage Notes (Signed)
Patient here today with c/o the rubber piece on her hearing aid stuck in right ear just before she came here today.

## 2023-07-10 NOTE — ED Provider Notes (Signed)
HPI  SUBJECTIVE:  Robin Richards is a 47 y.o. female who presents with a foreign body in her right ear.  She states that the rubber tip of her hearing aid is stuck in her right ear after taking her hearing aid out.  She reports decreased hearing and foreign body sensation in her ear.  This occurred 15 to 20 minutes prior to arrival.  No pain, swelling, drainage.  No aggravating or alleviating factors.  She has not tried anything for this.  She has a past medical history of asthma.  PCP: [facility].  Past Medical History:  Diagnosis Date   Arthritis    Asthma    Elevated hemoglobin A1c    Heartburn    Scoliosis     Past Surgical History:  Procedure Laterality Date   INCISION AND DRAINAGE ABSCESS N/A 03/22/2022   Procedure: INCISION AND DRAINAGE FACIAL ABSCESS WITH EXTRACTION OF TEETH;  Surgeon: Enis Slipper, DMD;  Location: [facility] OR;  Service: Oral Surgery;  Laterality: N/A;   SPINE SURGERY     TOOTH EXTRACTION N/A 03/22/2022   Procedure: DENTAL RESTORATION/EXTRACTIONS OF 17, 20, 23, 24, 25;  Surgeon: Enis Slipper, DMD;  Location: [facility] OR;  Service: Oral Surgery;  Laterality: N/A;    Family History  Problem Relation Age of Onset   Cirrhosis Mother    Cancer Father    Hypertension Sister    Thyroid disease Sister    Breast cancer Maternal Grandmother     Social History   Tobacco Use   Smoking status: Former    Current packs/day: 0.50    Average packs/day: 0.5 packs/day for 20.0 years (10.0 ttl pk-yrs)    Types: Cigarettes   Smokeless tobacco: Never  Vaping Use   Vaping status: Never Used  Substance Use Topics   Alcohol use: No    Alcohol/week: 0.0 standard drinks of alcohol   Drug use: No    No current facility-administered medications for this encounter.  Current Outpatient Medications:    acetaminophen (TYLENOL) 500 MG tablet, Take 500 mg by mouth every 6 (six) hours as needed for mild pain., Disp: , Rfl:    albuterol (PROVENTIL HFA;VENTOLIN HFA) 108 (90  Base) MCG/ACT inhaler, Inhale 1-2 puffs into the lungs every 6 (six) hours as needed for wheezing or shortness of breath., Disp: 3 Inhaler, Rfl: 0   atorvastatin (LIPITOR) 20 MG tablet, Take 20 mg by mouth daily., Disp: , Rfl:    ibuprofen (ADVIL,MOTRIN) 600 MG tablet, TAKE 1 TABLET BY MOUTH EVERY 6 HOURS AS NEEDED FOR FEVER,HEADACHE,OR MILD PAIN (Patient taking differently: Take 600 mg by mouth every 6 (six) hours as needed for mild pain.), Disp: 30 tablet, Rfl: 1  No Known Allergies   ROS  As noted in HPI.   Physical Exam  BP 124/79 (BP Location: Right Arm)   Pulse 77   Temp 97.9 F (36.6 C) (Oral)   Resp 16   Ht 5\' 2"  (1.575 m)   Wt 56.7 kg   LMP  (LMP Unknown)   SpO2 97%   BMI 22.86 kg/m   Constitutional: Well developed, well nourished, no acute distress Eyes:  EOMI, conjunctiva normal bilaterally HENT: Normocephalic, atraumatic,mucus membranes moist.  Positive yellow foreign body right ear. Respiratory: Normal inspiratory effort Cardiovascular: Normal rate GI: nondistended skin: No rash, skin intact Musculoskeletal: no deformities Neurologic: Alert & oriented x 3, no focal neuro deficits Psychiatric: Speech and behavior appropriate   ED Course   Medications - No data  to display  No orders of the defined types were placed in this encounter.   No results found for this or any previous visit (from the past 24 hour(s)). No results found.  ED Clinical Impression  1. Foreign body of right ear, initial encounter      ED Assessment/Plan      Procedure note: After obtaining verbal consent, visualized a rubber earpiece in patient's right ear.  Using forceps, removed it in its entirety.  EAC, TM normal and intact post removal.  Patient tolerated procedure well.    May use Debrox in the future as needed if she has any wax buildup.  A few drops of 50/50 mix of white vinegar and isopropyl alcohol for any signs of otitis externa.  Discussed MDM, treatment plan,  and plan for follow-up with patient.. patient agrees with plan.   No orders of the defined types were placed in this encounter.     *This clinic note was created using Dragon dictation software. Therefore, there may be occasional mistakes despite careful proofreading.  ?    Domenick Gong, MD 07/10/23 [phone]

## 2024-06-12 ENCOUNTER — Emergency Department (HOSPITAL_COMMUNITY)
Admission: EM | Admit: 2024-06-12 | Discharge: 2024-06-13 | Disposition: A | Discharge status: Home / Self Care | Attending: Emergency Medicine | Admitting: Emergency Medicine

## 2024-06-12 ENCOUNTER — Emergency Department (HOSPITAL_COMMUNITY)

## 2024-06-12 ENCOUNTER — Encounter (HOSPITAL_COMMUNITY): Payer: Self-pay | Admitting: Emergency Medicine

## 2024-06-12 ENCOUNTER — Other Ambulatory Visit: Payer: Self-pay

## 2024-06-12 DIAGNOSIS — Y92410 Unspecified street and highway as the place of occurrence of the external cause: Secondary | ICD-10-CM | POA: Diagnosis not present

## 2024-06-12 DIAGNOSIS — M542 Cervicalgia: Secondary | ICD-10-CM | POA: Diagnosis not present

## 2024-06-12 DIAGNOSIS — S0990XA Unspecified injury of head, initial encounter: Secondary | ICD-10-CM | POA: Insufficient documentation

## 2024-06-12 DIAGNOSIS — J45909 Unspecified asthma, uncomplicated: Secondary | ICD-10-CM | POA: Insufficient documentation

## 2024-06-12 DIAGNOSIS — M25512 Pain in left shoulder: Secondary | ICD-10-CM | POA: Diagnosis not present

## 2024-06-12 MED ORDER — ACETAMINOPHEN 325 MG PO TABS
650.0000 mg | ORAL_TABLET | Freq: Once | ORAL | Status: AC
  Administered 2024-06-12: 650 mg via ORAL
  Filled 2024-06-12: qty 2
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
## 2024-06-12 NOTE — Discharge Instructions (Signed)
 Follow-up with primary care doctor.  I would recommend alternating Tylenol  and ibuprofen  for pain control.  He can also use ice heat or heat over area of pain.  Return to ER with new or worsening symptoms.
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
## 2024-06-13 ENCOUNTER — Emergency Department (HOSPITAL_COMMUNITY)

## 2024-06-13 DIAGNOSIS — S0990XA Unspecified injury of head, initial encounter: Secondary | ICD-10-CM | POA: Diagnosis not present
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
